# Patient Record
Sex: Female | Born: 1965 | Race: White | Hispanic: No | Smoking: Former smoker
Health system: Southern US, Community
[De-identification: ages and names within clinical notes are randomized; demographics above are authoritative.]

## PROBLEM LIST (undated history)

## (undated) DIAGNOSIS — M779 Enthesopathy, unspecified: Secondary | ICD-10-CM

## (undated) DIAGNOSIS — E781 Pure hyperglyceridemia: Secondary | ICD-10-CM

## (undated) DIAGNOSIS — K5792 Diverticulitis of intestine, part unspecified, without perforation or abscess without bleeding: Secondary | ICD-10-CM

## (undated) DIAGNOSIS — B009 Herpesviral infection, unspecified: Secondary | ICD-10-CM

## (undated) DIAGNOSIS — K08409 Partial loss of teeth, unspecified cause, unspecified class: Secondary | ICD-10-CM

## (undated) HISTORY — PX: ABDOMINAL HYSTERECTOMY: SHX81

## (undated) HISTORY — PX: TONSILLECTOMY: SUR1361

## (undated) HISTORY — PX: OVARIAN CYST REMOVAL: SHX89

## (undated) HISTORY — PX: PLACEMENT OF BREAST IMPLANTS: SHX6334

---

## 2011-10-20 ENCOUNTER — Emergency Department: Payer: Self-pay | Admitting: Emergency Medicine

## 2012-01-20 ENCOUNTER — Ambulatory Visit: Payer: Self-pay | Admitting: Gastroenterology

## 2012-01-24 LAB — PATHOLOGY REPORT

## 2012-12-28 DIAGNOSIS — E781 Pure hyperglyceridemia: Secondary | ICD-10-CM | POA: Insufficient documentation

## 2012-12-28 DIAGNOSIS — Z8249 Family history of ischemic heart disease and other diseases of the circulatory system: Secondary | ICD-10-CM | POA: Insufficient documentation

## 2013-12-26 ENCOUNTER — Ambulatory Visit: Payer: Self-pay | Admitting: Family Medicine

## 2013-12-26 HISTORY — PX: AUGMENTATION MAMMAPLASTY: SUR837

## 2013-12-31 ENCOUNTER — Ambulatory Visit: Payer: Self-pay | Admitting: Family Medicine

## 2014-12-12 ENCOUNTER — Ambulatory Visit
Admission: RE | Admit: 2014-12-12 | Discharge: 2014-12-12 | Disposition: A | Discharge status: Home / Self Care | Payer: Managed Care, Other (non HMO) | Source: Ambulatory Visit | Attending: Family Medicine | Admitting: Family Medicine

## 2014-12-12 ENCOUNTER — Other Ambulatory Visit: Payer: Self-pay | Admitting: Family Medicine

## 2014-12-12 DIAGNOSIS — R1032 Left lower quadrant pain: Secondary | ICD-10-CM | POA: Diagnosis present

## 2014-12-12 MED ORDER — IOHEXOL 300 MG/ML  SOLN
100.0000 mL | Freq: Once | INTRAMUSCULAR | Status: AC | PRN
  Administered 2014-12-12: 100 mL via INTRAVENOUS

## 2014-12-14 DIAGNOSIS — Z8719 Personal history of other diseases of the digestive system: Secondary | ICD-10-CM | POA: Insufficient documentation

## 2014-12-30 ENCOUNTER — Ambulatory Visit
Admission: RE | Admit: 2014-12-30 | Discharge: 2014-12-30 | Disposition: A | Discharge status: Home / Self Care | Payer: Managed Care, Other (non HMO) | Source: Ambulatory Visit | Attending: Gastroenterology | Admitting: Gastroenterology

## 2014-12-30 ENCOUNTER — Other Ambulatory Visit: Payer: Self-pay | Admitting: Gastroenterology

## 2014-12-30 DIAGNOSIS — R1084 Generalized abdominal pain: Secondary | ICD-10-CM | POA: Diagnosis present

## 2014-12-30 DIAGNOSIS — R1031 Right lower quadrant pain: Secondary | ICD-10-CM | POA: Diagnosis present

## 2014-12-30 DIAGNOSIS — K560 Paralytic ileus: Secondary | ICD-10-CM | POA: Insufficient documentation

## 2014-12-30 DIAGNOSIS — R1032 Left lower quadrant pain: Secondary | ICD-10-CM | POA: Diagnosis present

## 2014-12-31 ENCOUNTER — Ambulatory Visit
Admission: RE | Admit: 2014-12-31 | Discharge: 2014-12-31 | Disposition: A | Discharge status: Home / Self Care | Payer: Managed Care, Other (non HMO) | Source: Ambulatory Visit | Attending: Gastroenterology | Admitting: Gastroenterology

## 2014-12-31 ENCOUNTER — Other Ambulatory Visit: Payer: Self-pay | Admitting: Gastroenterology

## 2014-12-31 ENCOUNTER — Other Ambulatory Visit
Admission: RE | Admit: 2014-12-31 | Discharge: 2014-12-31 | Disposition: A | Discharge status: Home / Self Care | Payer: Managed Care, Other (non HMO) | Source: Ambulatory Visit | Attending: *Deleted | Admitting: *Deleted

## 2014-12-31 DIAGNOSIS — R1031 Right lower quadrant pain: Secondary | ICD-10-CM

## 2014-12-31 DIAGNOSIS — R1084 Generalized abdominal pain: Secondary | ICD-10-CM

## 2014-12-31 DIAGNOSIS — I700 Atherosclerosis of aorta: Secondary | ICD-10-CM | POA: Insufficient documentation

## 2014-12-31 DIAGNOSIS — R109 Unspecified abdominal pain: Secondary | ICD-10-CM | POA: Diagnosis not present

## 2014-12-31 DIAGNOSIS — R1032 Left lower quadrant pain: Secondary | ICD-10-CM

## 2014-12-31 LAB — C DIFFICILE QUICK SCREEN W PCR REFLEX
C DIFFICILE (CDIFF) INTERP: NEGATIVE
C DIFFICILE (CDIFF) TOXIN: NEGATIVE
C DIFFICLE (CDIFF) ANTIGEN: NEGATIVE

## 2014-12-31 MED ORDER — IOHEXOL 300 MG/ML  SOLN
100.0000 mL | Freq: Once | INTRAMUSCULAR | Status: AC | PRN
  Administered 2014-12-31: 100 mL via INTRAVENOUS

## 2015-03-05 ENCOUNTER — Encounter: Payer: Self-pay | Admitting: *Deleted

## 2015-03-06 ENCOUNTER — Ambulatory Visit
Admission: RE | Admit: 2015-03-06 | Discharge: 2015-03-06 | Disposition: A | Discharge status: Home / Self Care | Payer: Managed Care, Other (non HMO) | Source: Ambulatory Visit | Attending: Gastroenterology | Admitting: Gastroenterology

## 2015-03-06 ENCOUNTER — Encounter
Admission: RE | Disposition: A | Discharge status: Home / Self Care | Payer: Self-pay | Source: Ambulatory Visit | Attending: Gastroenterology

## 2015-03-06 ENCOUNTER — Ambulatory Visit: Payer: Managed Care, Other (non HMO) | Admitting: Anesthesiology

## 2015-03-06 DIAGNOSIS — Z1211 Encounter for screening for malignant neoplasm of colon: Secondary | ICD-10-CM | POA: Diagnosis present

## 2015-03-06 DIAGNOSIS — K621 Rectal polyp: Secondary | ICD-10-CM | POA: Diagnosis not present

## 2015-03-06 DIAGNOSIS — K5730 Diverticulosis of large intestine without perforation or abscess without bleeding: Secondary | ICD-10-CM | POA: Diagnosis not present

## 2015-03-06 DIAGNOSIS — Z86010 Personal history of colonic polyps: Secondary | ICD-10-CM | POA: Diagnosis present

## 2015-03-06 HISTORY — DX: Diverticulitis of intestine, part unspecified, without perforation or abscess without bleeding: K57.92

## 2015-03-06 HISTORY — DX: Herpesviral infection, unspecified: B00.9

## 2015-03-06 HISTORY — DX: Partial loss of teeth, unspecified cause, unspecified class: K08.409

## 2015-03-06 HISTORY — PX: COLONOSCOPY WITH PROPOFOL: SHX5780

## 2015-03-06 HISTORY — DX: Enthesopathy, unspecified: M77.9

## 2015-03-06 HISTORY — DX: Pure hyperglyceridemia: E78.1

## 2015-03-06 SURGERY — COLONOSCOPY WITH PROPOFOL
Anesthesia: General

## 2015-03-06 MED ORDER — MIDAZOLAM HCL 2 MG/2ML IJ SOLN
INTRAMUSCULAR | Status: DC | PRN
  Administered 2015-03-06: 2 mg via INTRAVENOUS

## 2015-03-06 MED ORDER — SODIUM CHLORIDE 0.9 % IV SOLN: INTRAVENOUS | Status: DC

## 2015-03-06 MED ORDER — SODIUM CHLORIDE 0.9 % IV SOLN
INTRAVENOUS | Status: DC
  Administered 2015-03-06: 1000 mL via INTRAVENOUS

## 2015-03-06 MED ORDER — PHENYLEPHRINE HCL 10 MG/ML IJ SOLN
INTRAMUSCULAR | Status: DC | PRN
  Administered 2015-03-06 (×2): 100 ug via INTRAVENOUS

## 2015-03-06 MED ORDER — PROPOFOL 500 MG/50ML IV EMUL
INTRAVENOUS | Status: DC | PRN
  Administered 2015-03-06: 100 ug/kg/min via INTRAVENOUS

## 2015-03-06 NOTE — Op Note (Signed)
[facility] Gastroenterology Patient Name: Vanessa Saunders Procedure Date: 03/06/2015 10:39 AM MRN: [id number] Account #: [id number] Date of Birth: 1965-08-25 Admit Type: Outpatient Age: 49 Room: [facility] ENDO ROOM 2 Gender: Female Note Status: Finalized Procedure:         Colonoscopy Indications:       Personal history of colonic polyps, Follow-up of                     diverticulitis Providers:         Christena Deem, MD Referring MD:      Leim Fabry, MD (Referring MD) Medicines:         Monitored Anesthesia Care Complications:     No immediate complications. Procedure:         Pre-Anesthesia Assessment:                    - ASA Grade Assessment: II - A patient with mild systemic                     disease.                    After obtaining informed consent, the colonoscope was                     passed under direct vision. Throughout the procedure, the                     patient's blood pressure, pulse, and oxygen saturations                     were monitored continuously. The Colonoscope was                     introduced through the anus and advanced to the the cecum,                     identified by appendiceal orifice and ileocecal valve. The                     colonoscopy was performed without difficulty. The patient                     tolerated the procedure well. The quality of the bowel                     preparation was good. Findings:      Many small to medium diverticula were found in the sigmoid colon and in       the descending colon.      A 1 mm polyp was found in the rectum. The polyp was sessile. These       polyps were removed with a cold biopsy forceps. Resection and retrieval       were complete.      A 3 mm polyp was found in the rectum. The polyp was sessile. The polyp       was removed with a cold snare. Resection and retrieval were complete.      The digital rectal exam was normal.      The exam was otherwise without  abnormality. Impression:        - Diverticulosis in the sigmoid colon and in the  descending colon.                    - One 1 mm polyp in the rectum. Resected and retrieved.                    - One 3 mm polyp in the rectum. Resected and retrieved.                    - The examination was otherwise normal. Recommendation:    - Discharge patient to home. Procedure Code(s): --- Professional ---                    [id number], Colonoscopy, flexible; with removal of tumor(s),                     polyp(s), or other lesion(s) by snare technique                    45380, 59, Colonoscopy, flexible; with biopsy, single or                     multiple Diagnosis Code(s): --- Professional ---                    K62.1, Rectal polyp                    Z86.010, Personal history of colonic polyps                    K57.32, Diverticulitis of large intestine without                     perforation or abscess without bleeding                    K57.30, Diverticulosis of large intestine without                     perforation or abscess without bleeding CPT copyright 2014 American Medical Association. All rights reserved. The codes documented in this report are preliminary and upon coder review may  be revised to meet current compliance requirements. Christena DeemMartin U Jamerius Boeckman, MD 03/06/2015 11:16:12 AM This report has been signed electronically. Number of Addenda: 0 Note Initiated On: 03/06/2015 10:39 AM Scope Withdrawal Time: 0 hours 12 minutes 50 seconds  Total Procedure Duration: 0 hours 22 minutes 34 seconds       [facility]

## 2015-03-06 NOTE — Anesthesia Postprocedure Evaluation (Signed)
Anesthesia Post Note  Patient: Vanessa Saunders  Procedure(s) Performed: Procedure(s) (LRB): COLONOSCOPY WITH PROPOFOL (N/A)  Patient location during evaluation: PACU Anesthesia Type: General Level of consciousness: awake and alert Pain management: pain level controlled Vital Signs Assessment: post-procedure vital signs reviewed and stable Respiratory status: spontaneous breathing and respiratory function stable Cardiovascular status: blood pressure returned to baseline and stable Anesthetic complications: no    Last Vitals:  Filed Vitals:   03/06/15 1140 03/06/15 1150  BP: 98/50 108/83  Pulse:    Temp:    Resp: 15 14    Last Pain: There were no vitals filed for this visit.               KEPHART,WILLIAM K

## 2015-03-06 NOTE — Anesthesia Preprocedure Evaluation (Signed)
Anesthesia Evaluation  Patient identified by MRN, date of birth, ID band Patient awake    Reviewed: Allergy & Precautions, NPO status , Patient's Chart, lab work & pertinent test results  History of Anesthesia Complications (+) PROLONGED EMERGENCE  Airway Mallampati: III       Dental  (+) Teeth Intact   Pulmonary neg pulmonary ROS, former smoker,           Cardiovascular negative cardio ROS       Neuro/Psych negative neurological ROS     GI/Hepatic negative GI ROS, Neg liver ROS,   Endo/Other  negative endocrine ROS  Renal/GU negative Renal ROS     Musculoskeletal   Abdominal   Peds  Hematology negative hematology ROS (+)   Anesthesia Other Findings   Reproductive/Obstetrics                             Anesthesia Physical Anesthesia Plan  ASA: II  Anesthesia Plan: General   Post-op Pain Management:    Induction: Intravenous  Airway Management Planned: Nasal Cannula  Additional Equipment:   Intra-op Plan:   Post-operative Plan:   Informed Consent: I have reviewed the patients History and Physical, chart, labs and discussed the procedure including the risks, benefits and alternatives for the proposed anesthesia with the patient or authorized representative who has indicated his/her understanding and acceptance.     Plan Discussed with:   Anesthesia Plan Comments:         Anesthesia Quick Evaluation

## 2015-03-06 NOTE — H&P (Signed)
Outpatient short stay form Pre-procedure 03/06/2015 10:36 AM Christena DeemMartin U Skulskie MD  Primary Physician: Dr. Leim FabryBarbara Aldridge  Reason for visit:  Colonoscopy  History of present illness:  Patient is a 49 year old female presenting today for a follow-up on an episode of diverticulitis. She also has a personal history of colon polyps. She last took medication about 6 weeks ago. He has not had any pain for about 2-3 weeks. He had a CT scan that showed diverticulitis had resolved. No other apparent abnormalities. There is no family history of colon cancer.    Current facility-administered medications:  .  0.9 %  sodium chloride infusion, , Intravenous, Continuous, Christena DeemMartin U Skulskie, MD, Last Rate: 20 mL/hr at 03/06/15 0925, 1,000 mL at 03/06/15 0925 .  0.9 %  sodium chloride infusion, , Intravenous, Continuous, Christena DeemMartin U Skulskie, MD  Prescriptions prior to admission  Medication Sig Dispense Refill Last Dose  . acyclovir (ZOVIRAX) 400 MG tablet Take 400 mg by mouth 5 (five) times daily.   03/05/2015 at Unknown time  . metroNIDAZOLE (FLAGYL) 500 MG tablet Take 500 mg by mouth 3 (three) times daily.   03/05/2015 at Unknown time  . nystatin (MYCOSTATIN) 100000 UNIT/ML suspension Take 5 mLs by mouth 4 (four) times daily.   03/05/2015 at Unknown time  . sulfamethoxazole-trimethoprim (BACTRIM DS,SEPTRA DS) 800-160 MG tablet Take 1 tablet by mouth 2 (two) times daily.        Allergies  Allergen Reactions  . Prednisone Palpitations     Past Medical History  Diagnosis Date  . HSV-2 (herpes simplex virus 2) infection   . High triglycerides   . Diverticulitis   . H/O wisdom tooth extraction   . Bone spur RT HAND    Review of systems:      Physical Exam    Heart and lungs: Regular rate and rhythm without rub or gallop, lungs are bilaterally clear.    HEENT: Normocephalic atraumatic eyes are anicteric    Other:     Pertinant exam for procedure: Soft nontender nondistended bowel sounds  positive normoactive    Planned proceedures: Colonoscopy and indicated procedures. I have discussed the risks benefits and complications of procedures to include not limited to bleeding, infection, perforation and the risk of sedation and the patient wishes to proceed.    Christena DeemMartin U Skulskie, MD Gastroenterology 03/06/2015  10:36 AM

## 2015-03-06 NOTE — Transfer of Care (Signed)
Immediate Anesthesia Transfer of Care Note  Patient: Vanessa Saunders  Procedure(s) Performed: Procedure(s): COLONOSCOPY WITH PROPOFOL (N/A)  Patient Location: PACU  Anesthesia Type:General  Level of Consciousness: awake, alert  and oriented  Airway & Oxygen Therapy: Patient Spontanous Breathing and Patient connected to nasal cannula oxygen  Post-op Assessment: Report given to RN  Post vital signs: Reviewed and stable  Last Vitals:  Filed Vitals:   03/06/15 0911  BP: 105/59  Pulse: 73  Temp: 36 C  Resp: 16    Complications: No apparent anesthesia complications

## 2015-03-07 ENCOUNTER — Encounter: Payer: Self-pay | Admitting: Gastroenterology

## 2015-03-10 LAB — SURGICAL PATHOLOGY

## 2015-09-20 DIAGNOSIS — E039 Hypothyroidism, unspecified: Secondary | ICD-10-CM | POA: Insufficient documentation

## 2015-10-13 ENCOUNTER — Ambulatory Visit
Admission: RE | Admit: 2015-10-13 | Discharge: 2015-10-13 | Disposition: A | Discharge status: Home / Self Care | Payer: Managed Care, Other (non HMO) | Source: Ambulatory Visit | Attending: Family Medicine | Admitting: Family Medicine

## 2015-10-13 ENCOUNTER — Other Ambulatory Visit: Payer: Self-pay | Admitting: Family Medicine

## 2015-10-13 DIAGNOSIS — Z1231 Encounter for screening mammogram for malignant neoplasm of breast: Secondary | ICD-10-CM

## 2016-01-25 ENCOUNTER — Encounter: Payer: Self-pay | Admitting: *Deleted

## 2016-01-25 DIAGNOSIS — R768 Other specified abnormal immunological findings in serum: Secondary | ICD-10-CM | POA: Insufficient documentation

## 2016-02-03 ENCOUNTER — Ambulatory Visit (INDEPENDENT_AMBULATORY_CARE_PROVIDER_SITE_OTHER): Payer: Managed Care, Other (non HMO) | Admitting: General Surgery

## 2016-02-03 ENCOUNTER — Encounter: Payer: Self-pay | Admitting: General Surgery

## 2016-02-03 VITALS — BP 124/76 | HR 80 | Ht 62.0 in | Wt 155.0 lb

## 2016-02-03 DIAGNOSIS — K5732 Diverticulitis of large intestine without perforation or abscess without bleeding: Secondary | ICD-10-CM

## 2016-02-03 NOTE — Progress Notes (Signed)
Patient ID: Vanessa Saunders, female   DOB: 11/11/1965, 50 y.o.   MRN: [id number]  Chief Complaint  Patient presents with  . Abdominal Pain    HPI Vanessa Saunders is a 50 y.o. female here today for a evaluation of abdominal pain in her left lower quadrant.. Patient states this has been going on for 13 months. In September of 2016, she developed significant LLQ abdominal pain and confirmed diverticulitis with CT.  Was treated with antibiotics. Follow-up CT a month later showed resolution of diverticulitis.  Since that time, she has had at least 2 episodes of significant LLQ abdominal pain treated each time with antibiotics.  In between bouts, she has persistent milder pain in the same location, worse with constipation. She also has required larger doses of Miralax to correct her constipation. Last colonoscopy was done in 03/06/2015. I have reviewed the history of present illness with the patient.  HPI  Past Medical History:  Diagnosis Date  . Bone spur RT HAND  . Diverticulitis   . H/O wisdom tooth extraction   . High triglycerides   . HSV-2 (herpes simplex virus 2) infection     Past Surgical History:  Procedure Laterality Date  . ABDOMINAL HYSTERECTOMY    . AUGMENTATION MAMMAPLASTY Bilateral 12/2013   after mammo  . COLONOSCOPY WITH PROPOFOL N/A 03/06/2015   Procedure: COLONOSCOPY WITH PROPOFOL;  Surgeon: Lollie Sails, MD;  Location[facility] ENDOSCOPY;  Service: Endoscopy;  Laterality: N/A;  . OVARIAN CYST REMOVAL    . PLACEMENT OF BREAST IMPLANTS    . TONSILLECTOMY      Family History  Problem Relation Age of Onset  . Breast cancer Neg Hx     Social History Social History  Substance Use Topics  . Smoking status: Former Research scientist (life sciences)  . Smokeless tobacco: Never Used  . Alcohol use Yes    Allergies  Allergen Reactions  . Prednisone Palpitations    Current Outpatient Prescriptions  Medication Sig Dispense Refill  . acyclovir (ZOVIRAX) 400 MG tablet Take 400 mg by mouth 5  (five) times daily.    Marland Kitchen dicyclomine (BENTYL) 10 MG capsule Take 10 mg by mouth 4 (four) times daily -  before meals and at bedtime.    Marland Kitchen levothyroxine (SYNTHROID, LEVOTHROID) 75 MCG tablet Take 75 mcg by mouth daily before breakfast.    . ondansetron (ZOFRAN) 4 MG tablet Take 4 mg by mouth every 8 (eight) hours as needed for nausea or vomiting.    . meloxicam (MOBIC) 7.5 MG tablet Take 7.5 mg by mouth daily.     No current facility-administered medications for this visit.     Review of Systems Review of Systems  Constitutional: Negative.   Respiratory: Negative.   Cardiovascular: Negative.   Gastrointestinal: Positive for abdominal pain and nausea.    Blood pressure 124/76, pulse 80, height _0  (1.575 m), weight 155 lb (70.3 kg).  Physical Exam Physical Exam  Constitutional: She is oriented to person, place, and time. She appears well-developed and well-nourished.  HENT:  Mouth/Throat: Oropharynx is clear and moist.  Eyes: Conjunctivae are normal. No scleral icterus.  Neck: Neck supple.  Cardiovascular: Normal rate, regular rhythm and normal heart sounds.   Pulmonary/Chest: Effort normal and breath sounds normal.  Abdominal: Soft. Normal appearance and bowel sounds are normal. There is tenderness ( LLQ focally tender) in the left lower quadrant.  Lymphadenopathy:    She has no cervical adenopathy.  Neurological: She is alert and oriented to person, place,  and time.  Skin: Skin is warm and dry.  Psychiatric: Her behavior is normal.    Data Reviewed Notes and CT scan reviewed Colonoscopy showing diverticulosis. Few benign polyps removed.  Assessment    Diverticulitis of sigmoid colon    Plan    Barium enema to examine extent of diverticulosis  Patient has been scheduled for a barium enema for 02-10-16 at [facility] at 10 am (arrive 9:30 am). Prep: NPO after midnight and pick up prep kit.   Discussed laparoscopic partial colectomy given the history of chronic pain and  several episodes of acute diverticulitis within the last year.  Risks and benefits were discussed. This is a relative indication and patient is agreeable to this. Requested barium enema to assess the extent of diverticulosis. She is to return after the barium enema for further discussion and scheduling.     This information has been scribed by Gaspar Cola CMA.    Domenik Trice G 02/03/2016, 3:31 PM

## 2016-02-03 NOTE — Patient Instructions (Addendum)
Barium enema The patient is aware to call back for any questions or concerns. Laparoscopic Colectomy Laparoscopic colectomy is surgery to remove part or all of the large intestine (colon). This procedure is used to treat several conditions, including:  Inflammation and infection of the colon (diverticulitis).  Tumors or masses in the colon.  Inflammatory bowel disease, such as Crohn disease or ulcerative colitis. Colectomy is an option when symptoms cannot be controlled with medicines.  Bleeding from the colon that cannot be controlled by another method.  Blockage or obstruction of the colon. LET [facility] KNOW ABOUT:  Any allergies you have.  All medicines you are taking, including vitamins, herbs, eye drops, creams, and over-the-counter medicines.  Previous problems you or members of your family have had with the use of anesthetics.  Any blood disorders you have.  Previous surgeries you have had.  Medical conditions you have. RISKS AND COMPLICATIONS Generally, this is a safe procedure. However, as with any procedure, complications can occur. Possible complications include:  Infection.  Bleeding.  Damage to other organs.  Leaking from where the colon was sewn together.  Future blockage of the small intestines from scar tissue. Another surgery may be needed to repair this. In some cases, complications such as damage to other organs or excessive bleeding may require the surgeon to convert from a laparoscopic procedure to an open procedure. This involves making a larger incision in the abdomen to perform the procedure. BEFORE THE PROCEDURE  Ask your health care provider about changing or stopping any regular medicines.  You may be prescribed an oral bowel prep. This involves drinking a large amount of medicated liquid, starting the day before your surgery. The liquid will cause you to have multiple loose stools until your stool is almost clear or light green.  This cleans out your colon in preparation for the surgery.  Do not eat or drink anything else once you have started the bowel prep, unless your health care provider tells you it is safe to do so.  You may also be given antibiotic pills to clean out your colon of bacteria. Be sure to follow the directions carefully and take the medicine at the correct time. PROCEDURE   Small monitors will be put on your body. They are used to check your heart, blood pressure, and oxygen level.  An IV access tube will be put into one of your veins. Medicine will be able to flow directly into your body through this IV tube.  You might be given a medicine to help you relax (sedative).  You will be given a medicine to make you sleep through the procedure (general anesthetic). A breathing tube may be placed into your lungs during the procedure.  A thin, flexible tube (catheter) will be placed into your bladder to collect urine.  A tube may be put in through your nose. It is called a nasogastric tube. It is used to remove stomach fluids after surgery until the intestines start working again.  Your abdomen will be filled with air so that it expands. This gives the surgeon more room to operate and makes your organs easier to see.  Several small cuts (incisions) are made in your abdomen.  A thin, lighted tube with a tiny camera on the end (laparoscope) is put through one of the small incisions. The camera on the laparoscope sends a picture to a TV screen in the operating room. This gives the surgeon a good view inside your abdomen.  Hollow tubes are put through the other small incisions in your abdomen. The tools needed for the procedure are put through these tubes.  Clamps or staples are put on both ends of the diseased part of the colon.  The part of the intestine between the clamps or staples is removed.  If possible, the ends of the healthy colon that remain will be stitched or stapled together to allow your  body to expel waste (stool).  Sometimes, the remaining colon cannot be stitched back together. If this is the case, a colostomy is needed. For a colostomy:  An opening (stoma) to the outside of your body is made through the abdomen.  The end of the colon is brought to the opening. It is stitched to the skin.  A bag is attached to the opening. Stool will drain into this bag. The bag is removable.  The colostomy can be temporary or permanent.  The incisions from the colectomy are closed with stitches or staples. AFTER THE PROCEDURE  You will be monitored closely in a recovery area until you are stable and doing well. You will then be moved to a regular hospital room.  You will need to receive fluids through an IV tube until your bowel function has returned. This may take 1-3 days. Once your bowels are working again, you will be started on clear liquids and then advanced to solid food as tolerated.  You will be given pain medicines to control your pain.   This information is not intended to replace advice given to you by your health care provider. Make sure you discuss any questions you have with your health care provider.   Document Released: 06/04/2002 Document Revised: 01/02/2013 Document Reviewed: 10/24/2012 Elsevier Interactive Patient Education [employer]2016 [employer] Inc.

## 2016-02-10 ENCOUNTER — Other Ambulatory Visit: Payer: Self-pay | Admitting: General Surgery

## 2016-02-10 ENCOUNTER — Ambulatory Visit
Admission: RE | Admit: 2016-02-10 | Discharge: 2016-02-10 | Disposition: A | Discharge status: Home / Self Care | Payer: Managed Care, Other (non HMO) | Source: Ambulatory Visit | Attending: General Surgery | Admitting: General Surgery

## 2016-02-10 DIAGNOSIS — K5732 Diverticulitis of large intestine without perforation or abscess without bleeding: Secondary | ICD-10-CM

## 2016-02-10 DIAGNOSIS — K5730 Diverticulosis of large intestine without perforation or abscess without bleeding: Secondary | ICD-10-CM | POA: Diagnosis not present

## 2016-02-11 ENCOUNTER — Telehealth: Payer: Self-pay | Admitting: *Deleted

## 2016-02-11 ENCOUNTER — Ambulatory Visit: Payer: Managed Care, Other (non HMO) | Admitting: General Surgery

## 2016-02-11 DIAGNOSIS — K5732 Diverticulitis of large intestine without perforation or abscess without bleeding: Secondary | ICD-10-CM | POA: Insufficient documentation

## 2016-02-11 NOTE — Telephone Encounter (Signed)
Patient cancelled follow up appointment to with Dr. Evette CristalSankar on 02/11/16. Patient [place] and will follow her care,per patient.

## 2016-03-15 DIAGNOSIS — Z9049 Acquired absence of other specified parts of digestive tract: Secondary | ICD-10-CM | POA: Insufficient documentation

## 2016-05-05 ENCOUNTER — Ambulatory Visit
Admission: EM | Admit: 2016-05-05 | Discharge: 2016-05-05 | Disposition: A | Discharge status: Home / Self Care | Payer: Commercial Managed Care - PPO | Attending: Family Medicine | Admitting: Family Medicine

## 2016-05-05 DIAGNOSIS — J111 Influenza due to unidentified influenza virus with other respiratory manifestations: Secondary | ICD-10-CM

## 2016-05-05 DIAGNOSIS — R509 Fever, unspecified: Secondary | ICD-10-CM

## 2016-05-05 DIAGNOSIS — R69 Illness, unspecified: Secondary | ICD-10-CM

## 2016-05-05 DIAGNOSIS — R5383 Other fatigue: Secondary | ICD-10-CM

## 2016-05-05 DIAGNOSIS — R6889 Other general symptoms and signs: Secondary | ICD-10-CM

## 2016-05-05 DIAGNOSIS — J3489 Other specified disorders of nose and nasal sinuses: Secondary | ICD-10-CM | POA: Diagnosis not present

## 2016-05-05 DIAGNOSIS — M791 Myalgia: Secondary | ICD-10-CM

## 2016-05-05 MED ORDER — HYDROCOD POLST-CPM POLST ER 10-8 MG/5ML PO SUER: 5.0000 mL | Freq: Two times a day (BID) | ORAL | 0 refills | Status: DC | PRN

## 2016-05-05 MED ORDER — FEXOFENADINE-PSEUDOEPHED ER 180-240 MG PO TB24: 1.0000 | ORAL_TABLET | Freq: Every day | ORAL | 0 refills | Status: DC

## 2016-05-05 MED ORDER — MELOXICAM 15 MG PO TABS: 15.0000 mg | ORAL_TABLET | Freq: Every day | ORAL | 1 refills | Status: DC

## 2016-05-05 MED ORDER — OSELTAMIVIR PHOSPHATE 75 MG PO CAPS: 75.0000 mg | ORAL_CAPSULE | Freq: Two times a day (BID) | ORAL | 0 refills | Status: DC

## 2016-05-05 NOTE — ED Provider Notes (Signed)
[facility]    CSN: [id number] Arrival date & time: 05/05/16  1159     History   Chief Complaint Chief Complaint  Patient presents with  . Cough    HPI Vanessa Saunders is a 51 y.o. female.   Patient is a 51 year old white female who's had a history of congestion cough body aches sore throat but no fever. He she was at work today Mr. out because of her general malaise and feeling so bad. She just returned back to work on Monday when she underwent a bowel resection because of diverticulitis and she had about 12 inches of her lower colon removed. She states Monday she started not feeling well Tuesday she was coughing congested and problem when she coughs it makes the surgical site her. She wasn't feeling well yesterday either and today the findings are out because she looks so bad. She states she's coughing aching all over nasal congestion and sore throat. She does not like this is strep she does not have her tonsils and feels is more irritation from postnasal drainage. She has not had a fever. But she does report working around people who had or been undergoing flu contact.. Long bowel resection she's had a hysterectomy tubal ligation versus removal placement of breast implants. Family medical history essentially noncontributory Smoking about 10 years ago and she is allergic to prednisone.   The history is provided by the patient. No language interpreter was used.  Influenza  Presenting symptoms: cough, fatigue, fever, headache, myalgias, rhinorrhea, shortness of breath and sore throat   Severity:  Moderate Onset quality:  Sudden Duration:  3 days Progression:  Worsening Chronicity:  New Relieved by:  Nothing Worsened by:  Nothing Ineffective treatments:  None tried Associated symptoms: nasal congestion   Associated symptoms: no chills and no decreased appetite     Past Medical History:  Diagnosis Date  . Bone spur RT HAND  . Diverticulitis   . H/O wisdom tooth  extraction   . High triglycerides   . HSV-2 (herpes simplex virus 2) infection     There are no active problems to display for this patient.   Past Surgical History:  Procedure Laterality Date  . ABDOMINAL HYSTERECTOMY    . AUGMENTATION MAMMAPLASTY Bilateral 12/2013   after mammo  . COLONOSCOPY WITH PROPOFOL N/A 03/06/2015   Procedure: COLONOSCOPY WITH PROPOFOL;  Surgeon: Christena Deem, MD;  Location: [facility] ENDOSCOPY;  Service: Endoscopy;  Laterality: N/A;  . OVARIAN CYST REMOVAL    . PLACEMENT OF BREAST IMPLANTS    . TONSILLECTOMY      OB History    Obstetric Comments   1st Menstrual Cycle:  10 1st Pregnancy:  16        Home Medications    Prior to Admission medications   Medication Sig Start Date End Date Taking? Authorizing Provider  acyclovir (ZOVIRAX) 400 MG tablet Take 400 mg by mouth 5 (five) times daily.   Yes Historical Provider, MD  levothyroxine (SYNTHROID, LEVOTHROID) 75 MCG tablet Take 50 mcg by mouth daily before breakfast.    Yes Historical Provider, MD  linaclotide (LINZESS) 145 MCG CAPS capsule Take 145 mcg by mouth daily before breakfast.   Yes Historical Provider, MD  chlorpheniramine-HYDROcodone (TUSSIONEX PENNKINETIC ER) 10-8 MG/5ML SUER Take 5 mLs by mouth every 12 (twelve) hours as needed for cough. 05/05/16   Hassan Rowan, MD  dicyclomine (BENTYL) 10 MG capsule Take 10 mg by mouth 4 (four) times daily -  before  meals and at bedtime.    Historical Provider, MD  fexofenadine-pseudoephedrine (ALLEGRA-D ALLERGY & CONGESTION) 180-240 MG 24 hr tablet Take 1 tablet by mouth daily. 05/05/16   Hassan Rowan, MD  meloxicam (MOBIC) 15 MG tablet Take 1 tablet (15 mg total) by mouth daily. 05/05/16   Hassan Rowan, MD  meloxicam (MOBIC) 7.5 MG tablet Take 7.5 mg by mouth daily.    Historical Provider, MD  ondansetron (ZOFRAN) 4 MG tablet Take 4 mg by mouth every 8 (eight) hours as needed for nausea or vomiting.    Historical Provider, MD  oseltamivir (TAMIFLU) 75 MG  capsule Take 1 capsule (75 mg total) by mouth 2 (two) times daily. 05/05/16   Hassan Rowan, MD    Family History Family History  Problem Relation Age of Onset  . Breast cancer Neg Hx     Social History Social History  Substance Use Topics  . Smoking status: Former Games developer  . Smokeless tobacco: Never Used  . Alcohol use Yes     Allergies   Prednisone   Review of Systems Review of Systems  Constitutional: Positive for fatigue and fever. Negative for chills and decreased appetite.  HENT: Positive for congestion, rhinorrhea and sore throat.   Respiratory: Positive for cough and shortness of breath.   Musculoskeletal: Positive for myalgias.  Neurological: Positive for headaches.  All other systems reviewed and are negative.    Physical Exam Triage Vital Signs ED Triage Vitals  Enc Vitals Group     BP 05/05/16 1212 103/66     Pulse Rate 05/05/16 1212 93     Resp 05/05/16 1212 18     Temp 05/05/16 1212 98.8 F (37.1 C)     Temp Source 05/05/16 1212 Oral     SpO2 05/05/16 1212 96 %     Weight 05/05/16 1210 158 lb (71.7 kg)     Height 05/05/16 1210 5\' 2"  (1.575 m)     Head Circumference --      Peak Flow --      Pain Score 05/05/16 1212 5     Pain Loc --      Pain Edu? --      Excl. in GC? --    No data found.   Updated Vital Signs BP 103/66 (BP Location: Left Arm)   Pulse 93   Temp 98.8 F (37.1 C) (Oral)   Resp 18   Ht 5\' 2"  (1.575 m)   Wt 158 lb (71.7 kg)   LMP  (LMP Unknown)   SpO2 96%   BMI 28.90 kg/m   Visual Acuity Right Eye Distance:   Left Eye Distance:   Bilateral Distance:    Right Eye Near:   Left Eye Near:    Bilateral Near:     Physical Exam  Constitutional: She is oriented to person, place, and time. She appears well-developed and well-nourished.  HENT:  Head: Normocephalic and atraumatic.  Right Ear: Hearing, tympanic membrane, external ear and ear canal normal.  Left Ear: Hearing, tympanic membrane, external ear and ear canal  normal.  Nose: Mucosal edema present. Right sinus exhibits no maxillary sinus tenderness and no frontal sinus tenderness. Left sinus exhibits no maxillary sinus tenderness and no frontal sinus tenderness.  Mouth/Throat: Uvula is midline, oropharynx is clear and moist and mucous membranes are normal. No uvula swelling. No posterior oropharyngeal erythema.  Eyes: Pupils are equal, round, and reactive to light.  Neck: Normal range of motion. Neck supple.  Cardiovascular: Normal rate and  regular rhythm.   Pulmonary/Chest: Effort normal and breath sounds normal.  Musculoskeletal: Normal range of motion.  Neurological: She is alert and oriented to person, place, and time.  Skin: Skin is warm.  Psychiatric: She has a normal mood and affect.  Vitals reviewed.    UC Treatments / Results  Labs (all labs ordered are listed, but only abnormal results are displayed) Labs Reviewed - No data to display  EKG  EKG Interpretation None       Radiology No results found.  Procedures Procedures (including critical care time)  Medications Ordered in UC Medications - No data to display   Initial Impression / Assessment and Plan / UC Course  I have reviewed the triage vital signs and the nursing notes.  Pertinent labs & imaging results that were available during my care of the patient were reviewed by me and considered in my medical decision making (see chart for details).     Explained patient nothing she has the flu. While she comes in because the cough and nasal congestion everything hit her postnasal congestion on Tuesday with cough on Wednesday. She is not she is not running a fever with myalgia aching approaching her for the flu. Tamiflu 75 mg twice a day test The cough Allegra-D for the congestion. Meloxicam for aches and pain and follow-up PCP as needed. Positive testing for the cough and gave her work note until Sunday so she was sent out of work today but she was covered now Friday and  Saturday and go back to work on Sunday.  Final Clinical Impressions(s) / UC Diagnoses   Final diagnoses:  Flu-like symptoms  Influenza-like illness    New Prescriptions Discharge Medication List as of 05/05/2016  1:24 PM    START taking these medications   Details  chlorpheniramine-HYDROcodone (TUSSIONEX PENNKINETIC ER) 10-8 MG/5ML SUER Take 5 mLs by mouth every 12 (twelve) hours as needed for cough., Starting Thu 05/05/2016, Normal    fexofenadine-pseudoephedrine (ALLEGRA-D ALLERGY & CONGESTION) 180-240 MG 24 hr tablet Take 1 tablet by mouth daily., Starting Thu 05/05/2016, Normal    !! meloxicam (MOBIC) 15 MG tablet Take 1 tablet (15 mg total) by mouth daily., Starting Thu 05/05/2016, Normal    oseltamivir (TAMIFLU) 75 MG capsule Take 1 capsule (75 mg total) by mouth 2 (two) times daily., Starting Thu 05/05/2016, Normal     !! - Potential duplicate medications found. Please discuss with provider.      Note: This dictation was prepared with Dragon dictation along with smaller phrase technology. Any transcriptional errors that result from this process are unintentional.   Hassan RowanEugene Lochlan Grygiel, MD 05/05/16 1404

## 2016-05-05 NOTE — ED Triage Notes (Signed)
Patient complains of cough,sinus pain and pressure. Patient reports that symptoms started yesterday. Patient reports that her doctor told her to come since she had a colectomy on 12/19.

## 2016-05-17 DIAGNOSIS — I7300 Raynaud's syndrome without gangrene: Secondary | ICD-10-CM | POA: Insufficient documentation

## 2017-04-27 ENCOUNTER — Other Ambulatory Visit: Payer: Self-pay | Admitting: Family Medicine

## 2017-04-27 DIAGNOSIS — Z1231 Encounter for screening mammogram for malignant neoplasm of breast: Secondary | ICD-10-CM

## 2017-05-02 ENCOUNTER — Ambulatory Visit
Admission: RE | Admit: 2017-05-02 | Discharge: 2017-05-02 | Disposition: A | Discharge status: Home / Self Care | Payer: Commercial Managed Care - PPO | Source: Ambulatory Visit | Attending: Family Medicine | Admitting: Family Medicine

## 2017-05-02 DIAGNOSIS — Z1231 Encounter for screening mammogram for malignant neoplasm of breast: Secondary | ICD-10-CM | POA: Diagnosis present

## 2017-11-13 DIAGNOSIS — K769 Liver disease, unspecified: Secondary | ICD-10-CM | POA: Insufficient documentation

## 2017-12-19 ENCOUNTER — Encounter: Payer: Self-pay | Admitting: Internal Medicine

## 2017-12-19 ENCOUNTER — Ambulatory Visit: Payer: Commercial Managed Care - PPO | Admitting: Internal Medicine

## 2017-12-19 VITALS — BP 126/80 | HR 73 | Ht 62.0 in | Wt 151.0 lb

## 2017-12-19 DIAGNOSIS — E039 Hypothyroidism, unspecified: Secondary | ICD-10-CM

## 2017-12-19 DIAGNOSIS — R5383 Other fatigue: Secondary | ICD-10-CM

## 2017-12-19 LAB — T3, FREE: T3 FREE: 3.6 pg/mL (ref 2.3–4.2)

## 2017-12-19 LAB — TSH: TSH: 0.73 u[IU]/mL (ref 0.35–4.50)

## 2017-12-19 LAB — VITAMIN D 25 HYDROXY (VIT D DEFICIENCY, FRACTURES): VITD: 22.71 ng/mL — AB (ref 30.00–100.00)

## 2017-12-19 LAB — VITAMIN B12: Vitamin B-12: 380 pg/mL (ref 211–911)

## 2017-12-19 LAB — T4, FREE: Free T4: 1.01 ng/dL (ref 0.60–1.60)

## 2017-12-19 NOTE — Progress Notes (Signed)
Patient ID: Vanessa Saunders, female   DOB: 02/21/1966, 52 y.o.   MRN: [id number]    HPI  Vanessa Saunders is a 52 y.o.-year-old female, referred by her PCP, Dr. Leim FabryBarbara Aldridge, for management of hypothyroidism.  Pt. has been dx with hypothyroidism in 2017 during w/u for fatigue, joint pain, exhaustion, weight gain >> on Levothyroxine 88 mcg (for the last year) (increased from 75 mcg).  She takes the thyroid hormone: - at night, 11 pm (6 pm dinner) - with water - no calcium, iron,  multivitamins  - Omeprazole in am  - last month, around the time she got married - has to take Linzess in am and this is the reason why she is taking levothyroxine at night  I reviewed pt's thyroid tests: 05/02/2017: TSH 0.44 (0.34-5.66) 12/05/2016: TSH 0.706 ... 04/28/2015: TSH 13.87 No results found for: TSH, FREET4, T3FREE  Antithyroid antibodies: No results found for: THGAB No components found for: TPOAB  Pt describes: -+ Weight gain -+ Fatigue -+ Both heat and cold intolerance - no depression - + constipation (she has IBS) - No hair loss  Pt denies feeling nodules in neck, hoarseness, dysphagia/odynophagia, SOB with lying down.  She has + FH of thyroid disorders in: Brother and mother. No FH of thyroid cancer.  No h/o radiation tx to head or neck. No recent use of iodine supplements.  Pt. also has a history of Raynaud's phenomenon. FH of RA in M, B, celiac disease.  She is walking at the brisk pace/jogging on her treadmill at home several times a week, in the morning.  She leaves home to get to work at 5 AM.  ROS: Constitutional: + See HPI, + nocturia Eyes: no blurry vision, no xerophthalmia ENT: no sore throat, no nodules palpated in throat, no dysphagia/odynophagia, no hoarseness Cardiovascular: no CP/SOB/palpitations/leg swelling Respiratory: no cough/SOB Gastrointestinal: no N/V/D/ + C/+ acid reflux Musculoskeletal: + Both muscle/joint aches Skin: no rashes Neurological: no  tremors/numbness/tingling/dizziness Psychiatric: no depression/anxiety + Low libido  Past Medical History:  Diagnosis Date  . Bone spur RT HAND  . Diverticulitis   . H/O wisdom tooth extraction   . High triglycerides   . HSV-2 (herpes simplex virus 2) infection    Past Surgical History:  Procedure Laterality Date  . ABDOMINAL HYSTERECTOMY    . AUGMENTATION MAMMAPLASTY Bilateral 12/2013   after mammo  . COLONOSCOPY WITH PROPOFOL N/A 03/06/2015   Procedure: COLONOSCOPY WITH PROPOFOL;  Surgeon: Christena DeemMartin U Skulskie, MD;  [facility];  Service: Endoscopy;  Laterality: N/A;  . OVARIAN CYST REMOVAL    . PLACEMENT OF BREAST IMPLANTS    . TONSILLECTOMY     Social History   Socioeconomic History  . Marital status:  Divorced, remarried 11/2017    Spouse name: Not on file  . Number of children: 2  . Years of education: Not on file  . Highest education level: Not on file  Occupational History  .  Management  Tobacco Use  . Smoking status: Former Games developermoker  . Smokeless tobacco: Never Used  Substance and Sexual Activity  . Alcohol use: Yes, whiskey, once a week  . Drug use: No   Current Outpatient Medications on File Prior to Visit  Medication Sig Dispense Refill  . acyclovir (ZOVIRAX) 400 MG tablet Take 400 mg by mouth 5 (five) times daily.    Marland Kitchen. levothyroxine (SYNTHROID, LEVOTHROID) 75 MCG tablet Take 50 mcg by mouth daily before breakfast.     . chlorpheniramine-HYDROcodone (TUSSIONEX PENNKINETIC  ER) 10-8 MG/5ML SUER Take 5 mLs by mouth every 12 (twelve) hours as needed for cough. (Patient not taking: Reported on 12/19/2017) 115 mL 0  . dicyclomine (BENTYL) 10 MG capsule Take 10 mg by mouth 4 (four) times daily -  before meals and at bedtime.    . fexofenadine-pseudoephedrine (ALLEGRA-D ALLERGY & CONGESTION) 180-240 MG 24 hr tablet Take 1 tablet by mouth daily. (Patient not taking: Reported on 12/19/2017) 30 tablet 0  . linaclotide (LINZESS) 145 MCG CAPS capsule Take 145 mcg by  mouth daily before breakfast.    . meloxicam (MOBIC) 15 MG tablet Take 1 tablet (15 mg total) by mouth daily. (Patient not taking: Reported on 12/19/2017) 30 tablet 1  . meloxicam (MOBIC) 7.5 MG tablet Take 7.5 mg by mouth daily.    . ondansetron (ZOFRAN) 4 MG tablet Take 4 mg by mouth every 8 (eight) hours as needed for nausea or vomiting.    Marland Kitchen oseltamivir (TAMIFLU) 75 MG capsule Take 1 capsule (75 mg total) by mouth 2 (two) times daily. (Patient not taking: Reported on 12/19/2017) 10 capsule 0   No current facility-administered medications on file prior to visit.    Allergies  Allergen Reactions  . Prednisone Palpitations   Family History  Problem Relation Age of Onset  . Hyperlipidemia Mother   . Thyroid disease Mother   . Diabetes Father   . Hyperlipidemia Father   . Diabetes Brother   . Hyperlipidemia Brother   . Thyroid disease Brother   . Breast cancer Neg Hx     PE: BP 126/80   Pulse 73   Ht 5\' 2"  (1.575 m)   Wt 151 lb (68.5 kg)   LMP  (LMP Unknown)   SpO2 97%   BMI 27.62 kg/m  Wt Readings from Last 3 Encounters:  12/19/17 151 lb (68.5 kg)  05/05/16 158 lb (71.7 kg)  02/03/16 155 lb (70.3 kg)   Constitutional: overweight, in NAD Eyes: PERRLA, EOMI, no exophthalmos ENT: moist mucous membranes, no thyromegaly, no cervical lymphadenopathy Cardiovascular: RRR, No MRG Respiratory: CTA B Gastrointestinal: abdomen soft, NT, ND, BS+ Musculoskeletal: no deformities, strength intact in all 4 Skin: moist, warm, no rashes Neurological: no tremor with outstretched hands, DTR normal in all 4  ASSESSMENT: 1. Hypothyroidism  2.  Fatigue  PLAN:  1. Patient with relatively recently diagnosed hypothyroidism, approximately 2 years ago, on levothyroxine therapy.  Her recent TFTs have been normal, however, she continues to feel fatigued, to not be able to lose weight, and to have heat and cold intolerance.  We discussed about the fact that she has multiple possible culprits for  her symptoms, including an autoimmune disease (Raynaud's phenomenon), menopause (she had hysterectomy without BSO in her 57s), and also possibly Hashimoto's thyroiditis. -She is now taking levothyroxine at night as she has to take Linzess in the morning.  However, we discussed that she can take levothyroxine first thing in the morning and Linzess 30 minutes later.  She will start doing so, which may give her more energy throughout the day and helpful sleep better at night.  After changing levothyroxine dosing, I advised her to come back in 1.5 months for recheck of her labs. - We discussed about correct intake of levothyroxine, fasting, with water, separated by at least 30 minutes from breakfast, and separated by more than 4 hours from calcium, iron, multivitamins, acid reflux medications (PPIs). - will check thyroid tests today: TSH, free T4 and will also add TPO and ATA antibodies  to screen for Hashimoto's thyroiditis.  We discussed that this is an autoimmune disease which usually causes inflammation in her thyroid.  We discussed about ways to improve her immune system and therefore to decrease the antibody levels.  One option is to also start selenium. -  I will see her back in 4 months  2.  Fatigue -Please also see above -Reviewed her most recent CBC and she is not anemic -We will check her vitamin D and vitamin B12 today along with her TFTs and thyroid antibodies  Component     Latest Ref Rng & Units 12/19/2017  TSH     0.35 - 4.50 uIU/mL 0.73  T4,Free(Direct)     0.60 - 1.60 ng/dL 0.98  Triiodothyronine,Free,Serum     2.3 - 4.2 pg/mL 3.6  Thyroperoxidase Ab SerPl-aCnc     <9 IU/mL 128 (H)  Thyroglobulin Ab     < or = 1 IU/mL <1  Vitamin B12     211 - 911 pg/mL 380  VITD     30.00 - 100.00 ng/mL 22.71 (L)   TFTs normal. Will keep the same dose of LT4 but move this in am, as discussed. New dx of Hashimoto's thyroiditis, which can contribute to her fatigue >> we can try Selenium 200  mcg daily. Also, vit D low >> started vitamin 4000 units daily and recheck at next visit. B12 vitamin level is also on the low side >> may benefit from taking 1000 mcg B12 daily.  Carlus Pavlov, MD PhD [facility] Endocrinology

## 2017-12-19 NOTE — Patient Instructions (Addendum)
Please stop at the lab.  Please continue levothyroxine 88 mcg daily but move it to am. You can drink the coffee with it. Take Linzess 30 min later.   Take the thyroid hormone every day, with water, at least 30 minutes before breakfast, separated by at least 4 hours from: - acid reflux medications - calcium - iron - multivitamins  Please come back for another visit in in 4 months but for labs in 1.5 months.

## 2017-12-20 DIAGNOSIS — E038 Other specified hypothyroidism: Secondary | ICD-10-CM | POA: Insufficient documentation

## 2017-12-20 DIAGNOSIS — R5383 Other fatigue: Secondary | ICD-10-CM | POA: Insufficient documentation

## 2017-12-20 DIAGNOSIS — E063 Autoimmune thyroiditis: Secondary | ICD-10-CM

## 2017-12-20 LAB — THYROID PEROXIDASE ANTIBODY: Thyroperoxidase Ab SerPl-aCnc: 128 IU/mL — ABNORMAL HIGH (ref <9)

## 2017-12-20 LAB — THYROGLOBULIN ANTIBODY

## 2017-12-21 ENCOUNTER — Telehealth: Payer: Self-pay | Admitting: Internal Medicine

## 2017-12-21 NOTE — Telephone Encounter (Signed)
Please advise 

## 2017-12-21 NOTE — Telephone Encounter (Signed)
Pt calling to see where she can get educated with her diagnosis hashimoto.She need a better understanding. Pt also want to know if she can get a prescription for her Vitamin D and stated she need to take 4000 mg. Please advise pt.

## 2017-12-21 NOTE — Telephone Encounter (Signed)
The vitamin D is over-the-counter. Regarding the resources for Hashimoto's thyroiditis, the best one is to go to thyroid.org: http://www.adkins.info/ Ty, C

## 2018-02-01 ENCOUNTER — Other Ambulatory Visit (INDEPENDENT_AMBULATORY_CARE_PROVIDER_SITE_OTHER): Payer: Commercial Managed Care - PPO

## 2018-02-01 DIAGNOSIS — E039 Hypothyroidism, unspecified: Secondary | ICD-10-CM

## 2018-02-01 LAB — T4, FREE: Free T4: 0.98 ng/dL (ref 0.60–1.60)

## 2018-02-01 LAB — TSH: TSH: 0.72 u[IU]/mL (ref 0.35–4.50)

## 2018-02-05 ENCOUNTER — Encounter: Payer: Self-pay | Admitting: Internal Medicine

## 2018-02-05 ENCOUNTER — Telehealth: Payer: Self-pay | Admitting: Internal Medicine

## 2018-02-05 NOTE — Telephone Encounter (Signed)
Please see MyChart message to patient from Dr. Elvera Lennox regarding this.

## 2018-02-05 NOTE — Telephone Encounter (Signed)
Patient stated she would like a call back about her lab results.

## 2018-04-20 ENCOUNTER — Encounter: Payer: Self-pay | Admitting: Internal Medicine

## 2018-04-20 ENCOUNTER — Ambulatory Visit (INDEPENDENT_AMBULATORY_CARE_PROVIDER_SITE_OTHER): Payer: Commercial Managed Care - PPO | Admitting: Internal Medicine

## 2018-04-20 VITALS — BP 120/70 | HR 60 | Ht 62.0 in | Wt 155.0 lb

## 2018-04-20 DIAGNOSIS — E538 Deficiency of other specified B group vitamins: Secondary | ICD-10-CM | POA: Diagnosis not present

## 2018-04-20 DIAGNOSIS — E038 Other specified hypothyroidism: Secondary | ICD-10-CM | POA: Diagnosis not present

## 2018-04-20 DIAGNOSIS — E063 Autoimmune thyroiditis: Secondary | ICD-10-CM | POA: Diagnosis not present

## 2018-04-20 DIAGNOSIS — E559 Vitamin D deficiency, unspecified: Secondary | ICD-10-CM | POA: Diagnosis not present

## 2018-04-20 LAB — VITAMIN B12

## 2018-04-20 LAB — VITAMIN D 25 HYDROXY (VIT D DEFICIENCY, FRACTURES): VITD: 42.41 ng/mL (ref 30.00–100.00)

## 2018-04-20 LAB — T4, FREE: FREE T4: 1.01 ng/dL (ref 0.60–1.60)

## 2018-04-20 LAB — TSH: TSH: 0.84 u[IU]/mL (ref 0.35–4.50)

## 2018-04-20 MED ORDER — LEVOTHYROXINE SODIUM 88 MCG PO TABS: 88.0000 ug | ORAL_TABLET | Freq: Every day | ORAL | 3 refills | Status: DC

## 2018-04-20 MED ORDER — LEVOTHYROXINE SODIUM 88 MCG PO TABS: 88.0000 ug | ORAL_TABLET | Freq: Every day | ORAL | 3 refills | Status: AC

## 2018-04-20 NOTE — Progress Notes (Addendum)
Patient ID: Vanessa Saunders, female   DOB: 12/01/1965, 53 y.o.   MRN: [id number]    HPI  Vanessa Saunders is a 53 y.o.-year-old female, returning for follow-up for hypothyroidism, diagnosed as Hashimoto's hypothyroidism at last visit, also low vitamin D and B12.   Last visi 4 months ago.  Pt. has been dx with hypothyroidism in 2017 during work-up for fatigue, joint pain, exhaustion, weight gain.    She is on levothyroxine 88 mcg daily, taken: - Now in the morning (3 am), previously at night - fasting - at least 30 min from b'fast - no Ca, Fe, MVI - + Omeprazole at night now (prn), previously in the morning - not on Biotin - takes Linzess in the morning - + Mg, Zn, Se, B12, D3  Reviewed patient's TFTs: Lab Results  Component Value Date   TSH 0.72 02/01/2018   TSH 0.73 12/19/2017   FREET4 0.98 02/01/2018   FREET4 1.01 12/19/2017   T3FREE 3.6 12/19/2017  05/02/2017: TSH 0.44 (0.34-5.66) 12/05/2016: TSH 0.706 ... 04/28/2015: TSH 13.87  Her thyroid antibodies were positive at last visit: Component     Latest Ref Rng & Units 12/19/2017  Thyroperoxidase Ab SerPl-aCnc     <9 IU/mL 128 (H)   Lab Results  Component Value Date   THGAB <1 12/19/2017   I recommended to start selenium 200 mcg daily to help decrease the antibody levels.  At last visit she was describing weight gain, fatigue, constipation (she has IBS).  She is now feeling better, with improved fatigue.  She wakes up at 3 AM to exercise on the treadmill for 30 to 40 minutes.  She has to leave the house by 5 AM.  She goes to bed at 7:30 PM.  Pt denies: - feeling nodules in neck - hoarseness - dysphagia - choking - SOB with lying down  She has + FH of thyroid disorders in: Brother and mother. No FH of thyroid cancer. No h/o radiation tx to head or neck.  No herbal supplements. No Biotin use.  No recent steroids use.   Pt. also has a history of Raynaud's phenomenon. FH of RA and dermatomyositis in M, B, celiac  disease. Daughter with TTP.  She is walking at the brisk pace/jogging on her treadmill at home several times a week, in the morning.  She leaves home to get to work at 5 AM.  Due to fatigue, at last visit we checked her vitamin B12 and vitamin D.  Her vitamin D was low, while vitamin B12 was close to the lower limit of normal: Lab Results  Component Value Date   VD25OH 22.71 (L) 12/19/2017  I recommended to start 4000 units vitamin D daily.  Lab Results  Component Value Date   VITAMINB12 380 12/19/2017  I recommended to start 1000 mcg vitamin B12 daily.  However, she saw a weight loss clinic that recommended B12 injections and she started weekly injections-last 3 weeks.  She has 4 injections left.  She was dx'ed with blepharitis since last OV.  ROS: Constitutional: no weight gain/no weight loss, + fatigue, + subjective hyperthermia, + subjective hypothermia Eyes: no blurry vision, no xerophthalmia ENT: no sore throat, + see HPI Cardiovascular: no CP/no SOB/no palpitations/no leg swelling Respiratory: no cough/no SOB/no wheezing Gastrointestinal: no N/no V/no D/+ C/no acid reflux Musculoskeletal: + muscle aches/+ joint aches Skin: no rashes, + hair loss Neurological: no tremors/no numbness/no tingling/no dizziness  I reviewed pt's medications, allergies, PMH, social hx, family hx,  and changes were documented in the history of present illness. Otherwise, unchanged from my initial visit note.  Past Medical History:  Diagnosis Date  . Bone spur RT HAND  . Diverticulitis   . H/O wisdom tooth extraction   . High triglycerides   . HSV-2 (herpes simplex virus 2) infection    Past Surgical History:  Procedure Laterality Date  . ABDOMINAL HYSTERECTOMY  ~2004  . AUGMENTATION MAMMAPLASTY Bilateral 12/2013   after mammo  . COLONOSCOPY WITH PROPOFOL N/A 03/06/2015   Procedure: COLONOSCOPY WITH PROPOFOL;  Surgeon: Christena DeemMartin U Skulskie, MD;  [facility];  Service: Endoscopy;   Laterality: N/A;  . OVARIAN CYST REMOVAL    . PLACEMENT OF BREAST IMPLANTS    . TONSILLECTOMY     Social History   Socioeconomic History  . Marital status:  Divorced, remarried 11/2017    Spouse name: Not on file  . Number of children: 2  . Years of education: Not on file  . Highest education level: Not on file  Occupational History  .  Management  Tobacco Use  . Smoking status: Former Games developermoker  . Smokeless tobacco: Never Used  Substance and Sexual Activity  . Alcohol use: Yes, whiskey, once a week  . Drug use: No   Current Outpatient Medications on File Prior to Visit  Medication Sig Dispense Refill  . acyclovir (ZOVIRAX) 400 MG tablet Take 400 mg by mouth 5 (five) times daily.    . chlorpheniramine-HYDROcodone (TUSSIONEX PENNKINETIC ER) 10-8 MG/5ML SUER Take 5 mLs by mouth every 12 (twelve) hours as needed for cough. (Patient not taking: Reported on 12/19/2017) 115 mL 0  . dicyclomine (BENTYL) 10 MG capsule Take 10 mg by mouth 4 (four) times daily -  before meals and at bedtime.    . fexofenadine-pseudoephedrine (ALLEGRA-D ALLERGY & CONGESTION) 180-240 MG 24 hr tablet Take 1 tablet by mouth daily. (Patient not taking: Reported on 12/19/2017) 30 tablet 0  . levothyroxine (SYNTHROID, LEVOTHROID) 75 MCG tablet Take 50 mcg by mouth daily before breakfast.     . linaclotide (LINZESS) 145 MCG CAPS capsule Take 145 mcg by mouth daily before breakfast.    . meloxicam (MOBIC) 15 MG tablet Take 1 tablet (15 mg total) by mouth daily. (Patient not taking: Reported on 12/19/2017) 30 tablet 1  . meloxicam (MOBIC) 7.5 MG tablet Take 7.5 mg by mouth daily.    . ondansetron (ZOFRAN) 4 MG tablet Take 4 mg by mouth every 8 (eight) hours as needed for nausea or vomiting.    Marland Kitchen. oseltamivir (TAMIFLU) 75 MG capsule Take 1 capsule (75 mg total) by mouth 2 (two) times daily. (Patient not taking: Reported on 12/19/2017) 10 capsule 0   No current facility-administered medications on file prior to visit.     Allergies  Allergen Reactions  . Prednisone Palpitations   Family History  Problem Relation Age of Onset  . Hyperlipidemia Mother   . Thyroid disease Mother   . Diabetes Father   . Hyperlipidemia Father   . Diabetes Brother   . Hyperlipidemia Brother   . Thyroid disease Brother   . Breast cancer Neg Hx     PE: BP 120/70   Pulse 60   Ht 5\' 2"  (1.575 m) Comment: measured  Wt 155 lb (70.3 kg)   LMP  (LMP Unknown)   SpO2 97%   BMI 28.35 kg/m  Wt Readings from Last 3 Encounters:  04/20/18 155 lb (70.3 kg)  12/19/17 151 lb (68.5 kg)  05/05/16  158 lb (71.7 kg)   Constitutional: normal weight, in NAD Eyes: PERRLA, EOMI, no exophthalmos ENT: moist mucous membranes, no thyromegaly, no cervical lymphadenopathy Cardiovascular: RRR, No MRG Respiratory: CTA B Gastrointestinal: abdomen soft, NT, ND, BS+ Musculoskeletal: no deformities, strength intact in all 4 Skin: moist, warm, no rashes Neurological: no tremor with outstretched hands, DTR normal in all 4  ASSESSMENT: 1. Hypothyroidism  2. Vitamin D insufficiency  3. Low B12  PLAN:  1. Patient with history of hypothyroidism, diagnosed approximately 2.5 years ago, on levothyroxine therapy.  At last visit we checked her thyroid antibodies and they returned positive, giving her a diagnosis of Hashimoto's thyroiditis.  I suggested to start selenium to help lower the antibodies -At last visit, her TFTs returned normal blood she was still feeling fatigued.  I suggested to move levothyroxine in the morning since she was taking it at night as she had to take Linzess in the morning.  We discussed that this may give her more energy during the day and could help with sleeping at night.  She is indeed feeling better, with still some fatigue, but overall more energy. - latest thyroid labs reviewed with pt >> normal 01/2018 - she continues on LT4 88 mcg daily - pt feels good on this dose. - we discussed about taking the thyroid hormone  every day, with water, >30 minutes before breakfast, separated by >4 hours from acid reflux medications, calcium, iron, multivitamins. Pt. is taking it correctly now, moved to a.m. since last visit. - will check thyroid tests today: TSH and fT4 - If labs are abnormal, she will need to return for repeat TFTs in 1.5 months  2. Vitamin D insufficiency -We checked a vitamin D level due to her complaint of fatigue at last visit >> vitamin D was low, 22.71 -We started 4000 units vitamin D daily, which she continues today -We will check another level today  3. Low B12 -At last visit, B12 was low in the normal range and I advised her to take 1000 mcg B12 daily. However, she started B12 im inj's 3 weeks ago >> once a week. -We will recheck a level today, but we discussed about spacing the injections out to once a month until she runs out and then to switch to 1000 mcg p.o. B12 daily  Needs refills.  Office Visit on 04/20/2018  Component Date Value Ref Range Status  . TSH 04/20/2018 0.84  0.35 - 4.50 uIU/mL Final  . Free T4 04/20/2018 1.01  0.60 - 1.60 ng/dL Final   Comment: Specimens from patients who are undergoing biotin therapy and /or ingesting biotin supplements may contain high levels of biotin.  The higher biotin concentration in these specimens interferes with this Free T4 assay.  Specimens that contain high levels  of biotin may cause false high results for this Free T4 assay.  Please interpret results in light of the total clinical presentation of the patient.    Marland Kitchen VITD 04/20/2018 42.41  30.00 - 100.00 ng/mL Final  . Vitamin B-12 04/20/2018 >1525* 211 - 911 pg/mL Final   Msg sent: Dear Ms. Ambrocio, The thyroid antibodies are not back yet, however, the rest of the labs are.  He was thyroid tests and vitamin D are normal.  Please continue the current doses of levothyroxine and vitamin D.  The B12 level is very high, so let us continue with the plan to space out the injections and then  continue with oral B12 as discussed. Sincerely, Silvestre Mesi  Irvin Bastin MD  Component     Latest Ref Rng & Units 04/20/2018  Thyroperoxidase Ab SerPl-aCnc     <9 IU/mL 193 (H)  Vitamin B12     211 - 911 pg/mL >1525 (H)  Thyroid antibodies are higher than before. She does take selenium.  Carlus Pavlov, MD PhD [facility]

## 2018-04-20 NOTE — Patient Instructions (Signed)
Please stop at the lab.  Continue Levothyroxine 88 mcg daily.  Take the thyroid hormone every day, with water, at least 30 minutes before breakfast, separated by at least 4 hours from: - acid reflux medications - calcium - iron - multivitamins  After the next B12 injection, space them once a month until you run out, then start 1000 mcg daily by mouth.  Continue vitamin D 4000 units daily.  Please come back for a follow-up appointment in 1 year.

## 2018-04-23 LAB — THYROID PEROXIDASE ANTIBODY: Thyroperoxidase Ab SerPl-aCnc: 193 IU/mL — ABNORMAL HIGH (ref <9)

## 2018-04-27 DIAGNOSIS — E063 Autoimmune thyroiditis: Secondary | ICD-10-CM | POA: Diagnosis not present

## 2018-05-03 IMAGING — MG MM DIGITAL SCREENING IMPLANTS W/ CAD
8 series · 8 of 8 positions shown · non-contrast
Comparison: Previous exam(s).

CLINICAL DATA: Screening.

EXAM:
DIGITAL SCREENING BILATERAL MAMMOGRAM WITH IMPLANTS AND CAD
The patient has new retro pectoral silicone implants. Standard and
implant displaced views were performed.

[L MLO (1 of 2)]
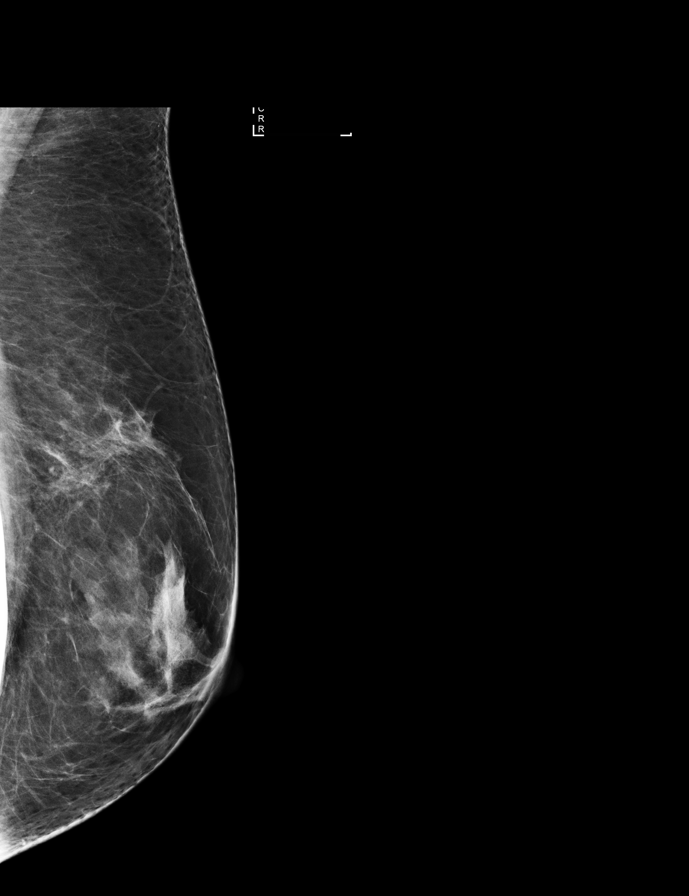

[L MLO (2 of 2)]
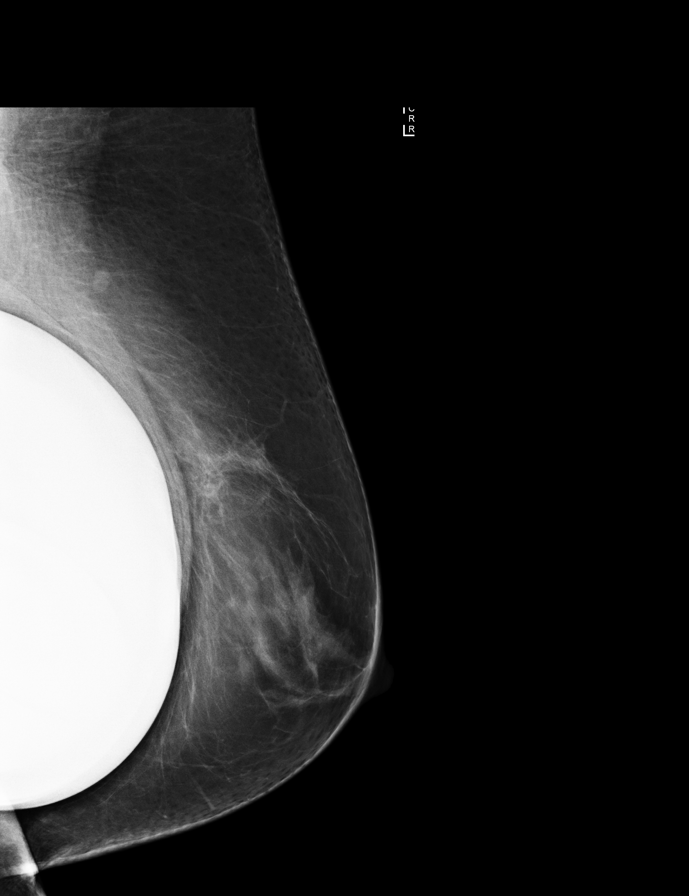

[R MLO (1 of 2)]
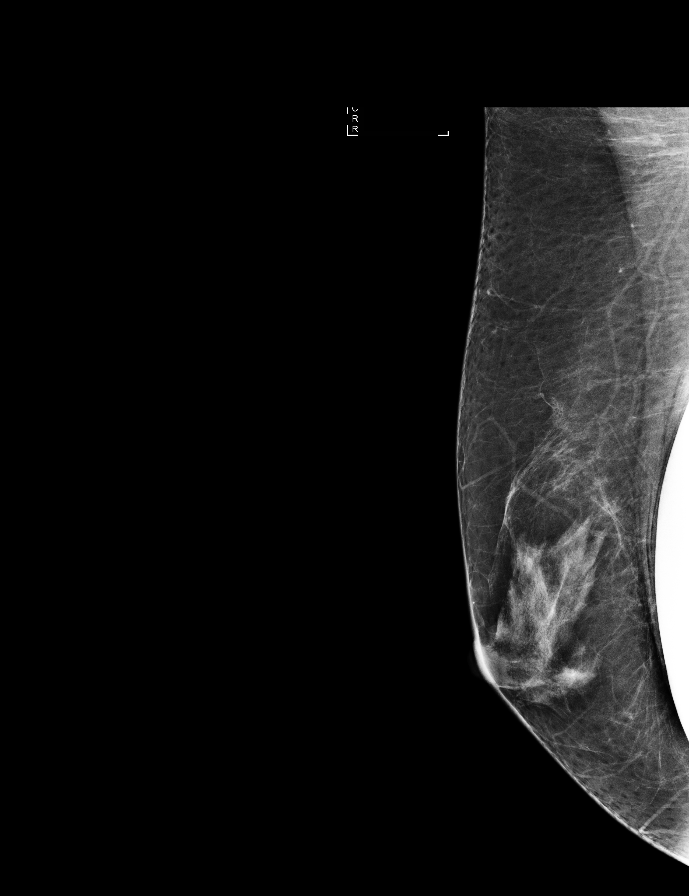

[R MLO (2 of 2)]
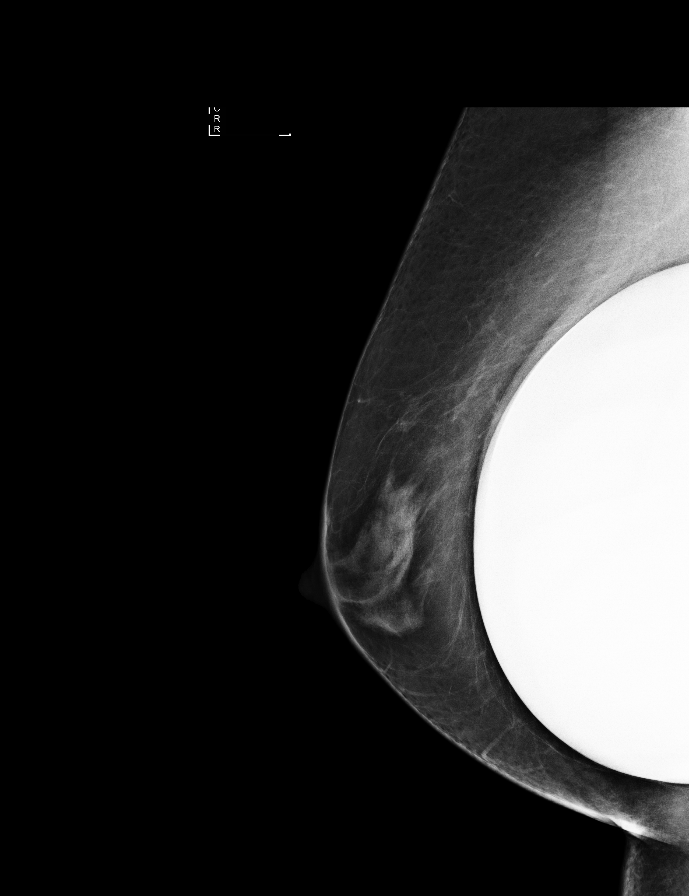

[L CC (1 of 2)]
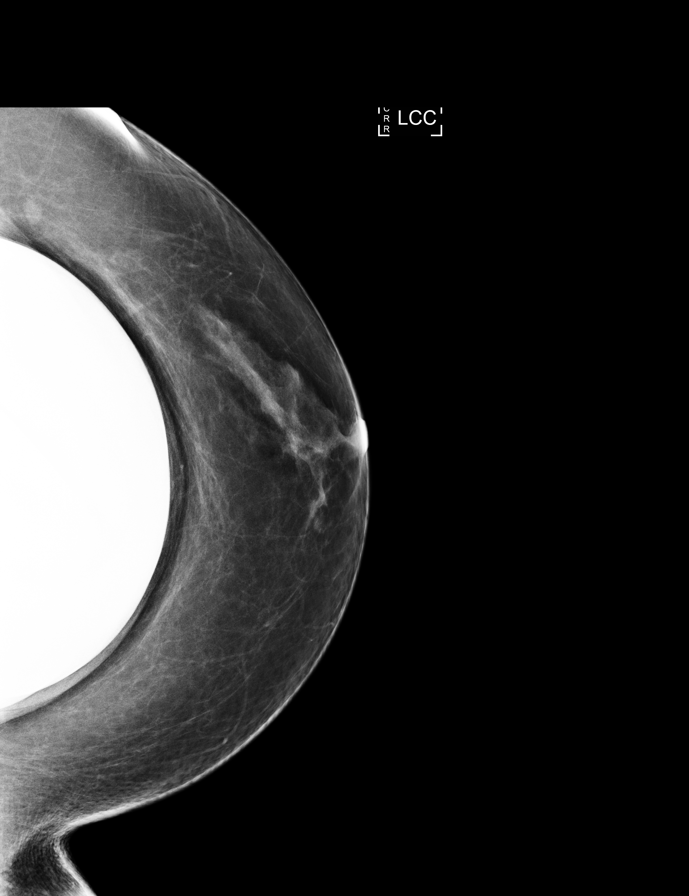

[L CC (2 of 2)]
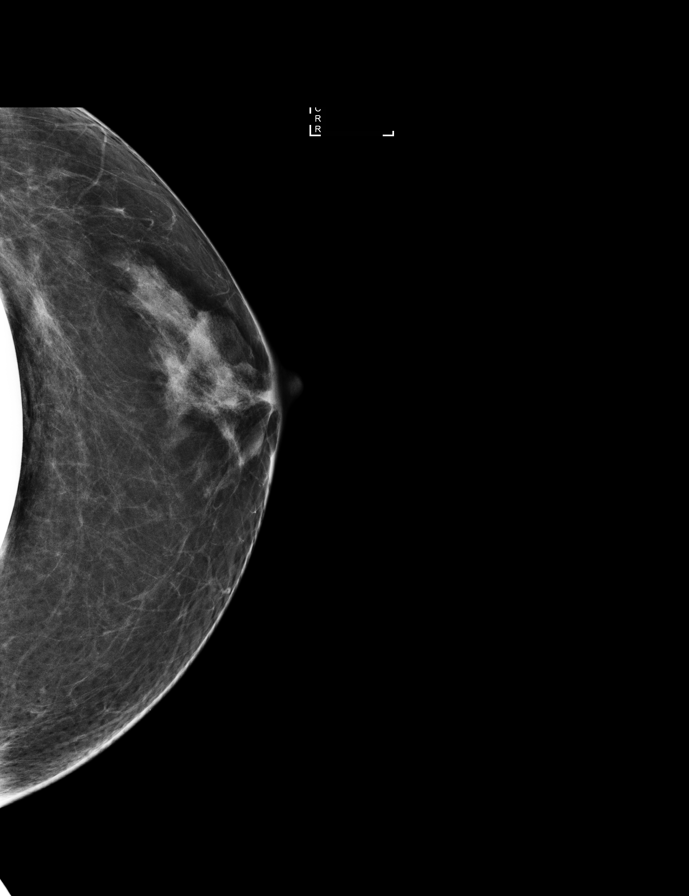

[R CC (1 of 2)]
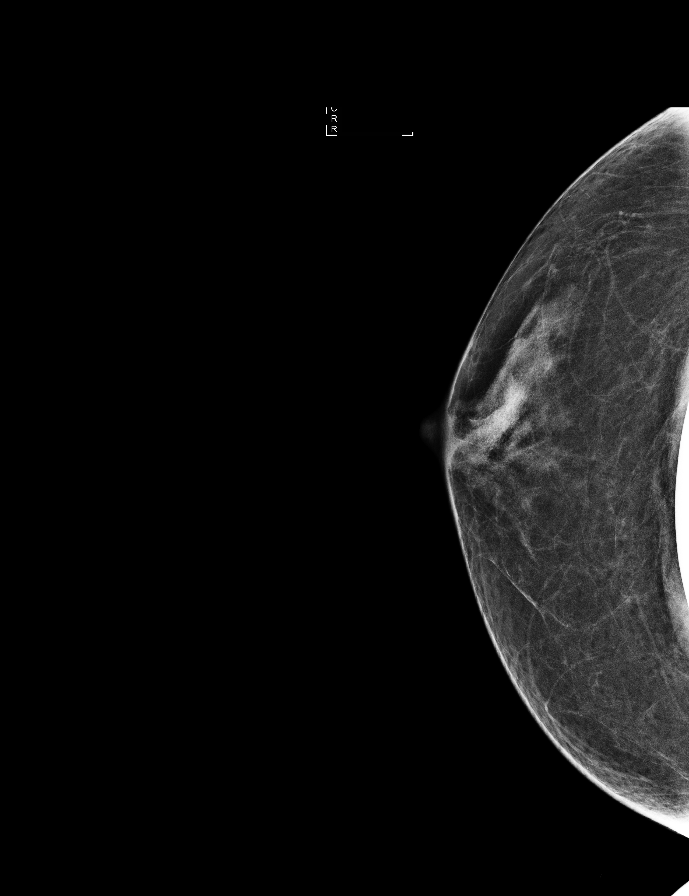

[R CC (2 of 2)]
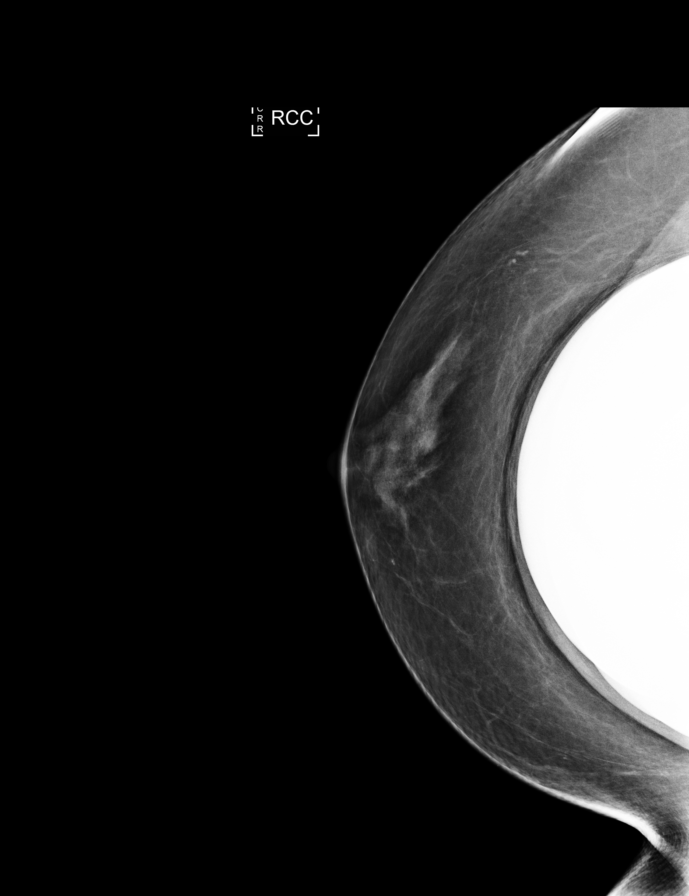

[8 of 8 positions shown; findings below may reference images not displayed]

ACR Breast Density Category c: The breast tissue is heterogeneously
dense, which may obscure small masses.
FINDINGS: There are no findings suspicious for malignancy. Images were
processed with CAD.
IMPRESSION: No mammographic evidence of malignancy. A result letter of this
screening mammogram will be mailed directly to the patient.

RECOMMENDATION:
Screening mammogram in one year. (Code:ZR-H-CB1)

BI-RADS CATEGORY  1:  Negative.

## 2018-05-21 DIAGNOSIS — I7300 Raynaud's syndrome without gangrene: Secondary | ICD-10-CM | POA: Diagnosis not present

## 2018-05-21 DIAGNOSIS — E039 Hypothyroidism, unspecified: Secondary | ICD-10-CM | POA: Diagnosis not present

## 2018-05-21 DIAGNOSIS — R768 Other specified abnormal immunological findings in serum: Secondary | ICD-10-CM | POA: Diagnosis not present

## 2018-07-24 DIAGNOSIS — K5790 Diverticulosis of intestine, part unspecified, without perforation or abscess without bleeding: Secondary | ICD-10-CM | POA: Diagnosis not present

## 2018-07-24 DIAGNOSIS — Z86010 Personal history of colonic polyps: Secondary | ICD-10-CM | POA: Diagnosis not present

## 2018-07-24 DIAGNOSIS — K5904 Chronic idiopathic constipation: Secondary | ICD-10-CM | POA: Diagnosis not present

## 2018-08-09 DIAGNOSIS — E559 Vitamin D deficiency, unspecified: Secondary | ICD-10-CM | POA: Diagnosis not present

## 2018-08-09 DIAGNOSIS — E038 Other specified hypothyroidism: Secondary | ICD-10-CM | POA: Diagnosis not present

## 2018-08-09 DIAGNOSIS — I7300 Raynaud's syndrome without gangrene: Secondary | ICD-10-CM | POA: Insufficient documentation

## 2018-08-09 DIAGNOSIS — K581 Irritable bowel syndrome with constipation: Secondary | ICD-10-CM | POA: Insufficient documentation

## 2018-08-09 DIAGNOSIS — E063 Autoimmune thyroiditis: Secondary | ICD-10-CM | POA: Diagnosis not present

## 2018-08-31 IMAGING — RF DG BE LIMITED  W/ CM
2 series · 7 of 10 positions shown · non-contrast
Comparison: CT scan of the abdomen pelvis December 31, 2014.

CLINICAL DATA: Diverticulitis, pre surgical planning.

EXAM:
SINGLE COLUMN BARIUM ENEMA
TECHNIQUE: Initial scout AP supine abdominal image obtained to insure adequate
colon cleansing. Barium was introduced into the colon in a
retrograde fashion and refluxed from the rectum to the cecum. Spot
images of the colon followed by overhead radiographs were obtained.
FLUOROSCOPY TIME:  Fluoroscopy Time:  2 minutes, 0 seconds
Radiation Exposure Index (if provided by the fluoroscopic device):
1839.25 micro Gy per meter squared
Number of Acquired Spot Images: 8 + 8 overhead radiographs

[Series 1: t abdomen supine · 0.14mm/px · 5 of 8 slices shown]
[im 1/8]
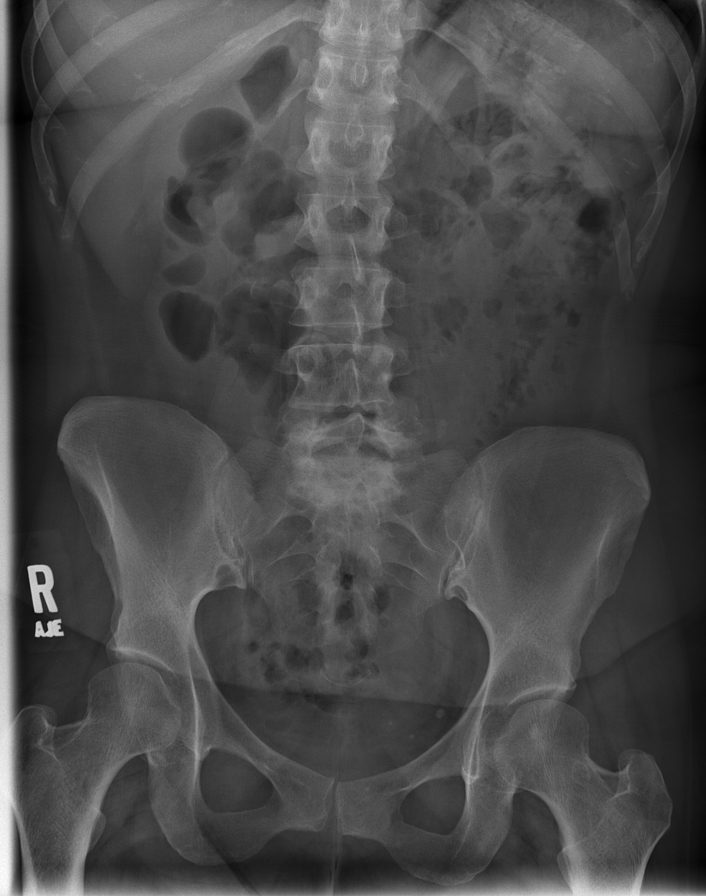
[im 2/8]
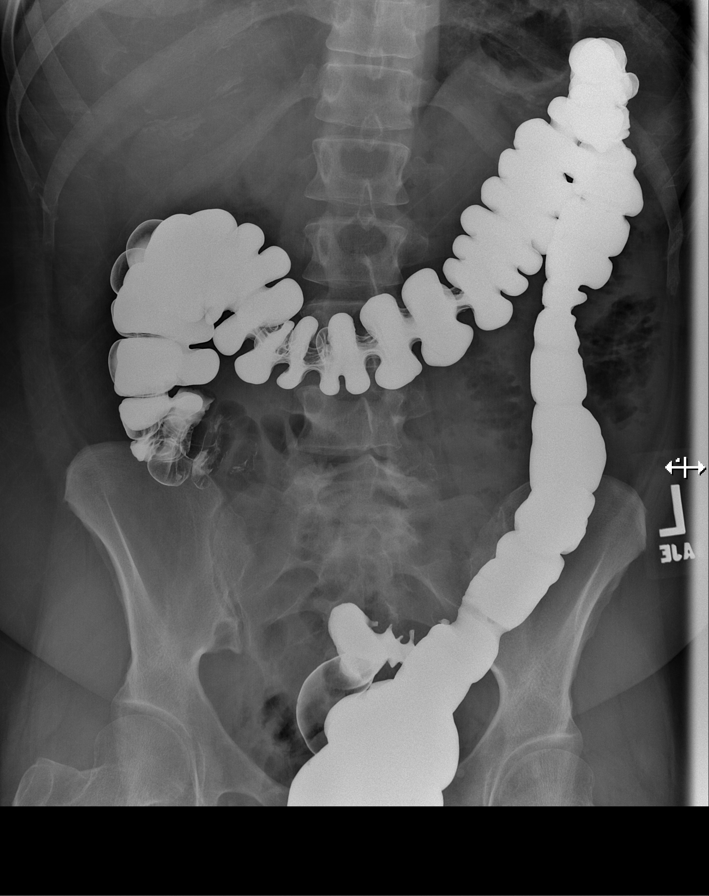
[im 4/8]
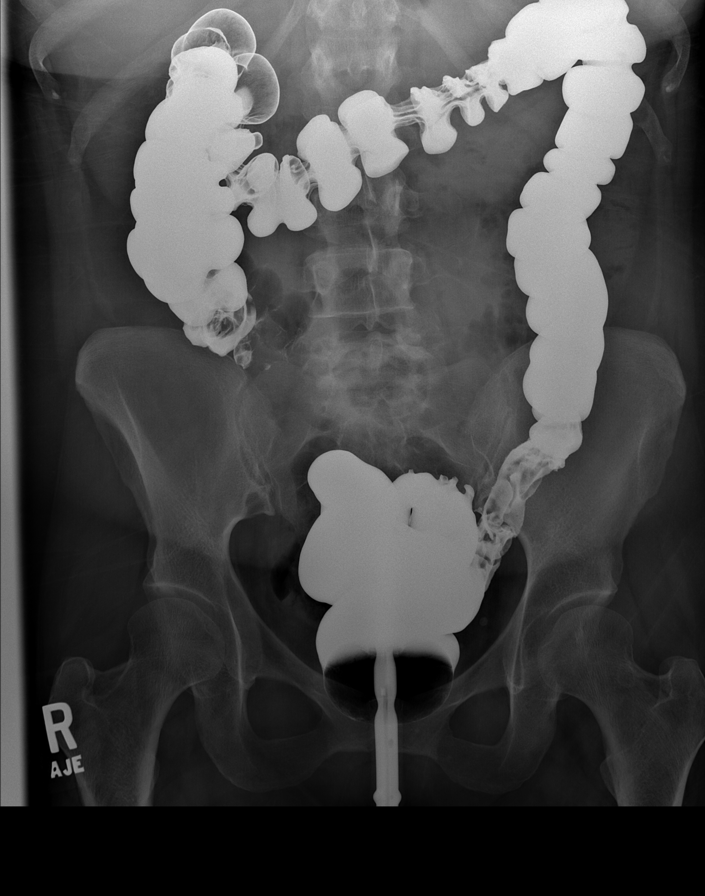
[im 6/8]
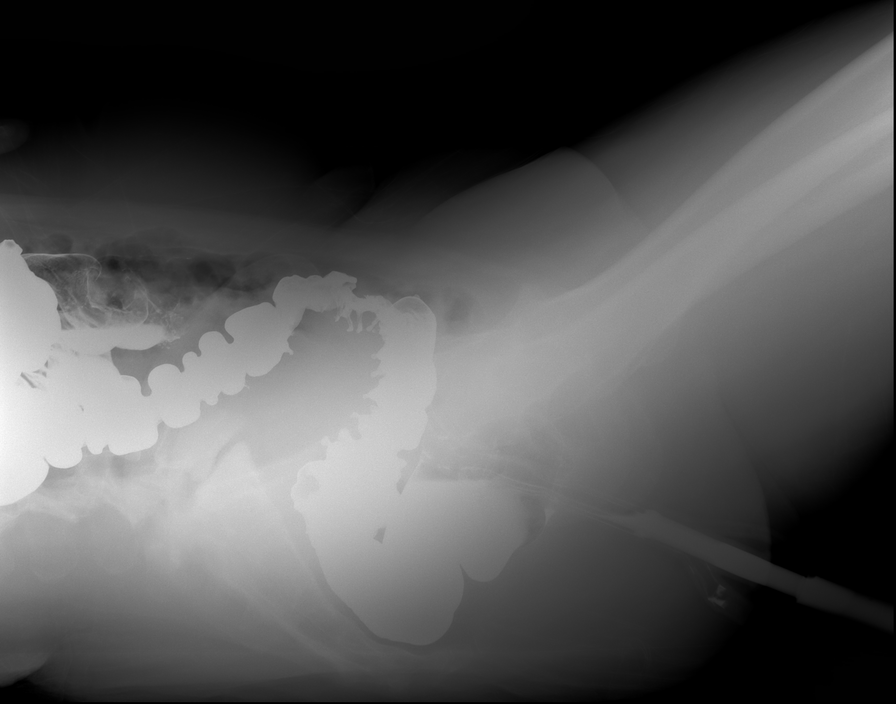
[im 8/8]
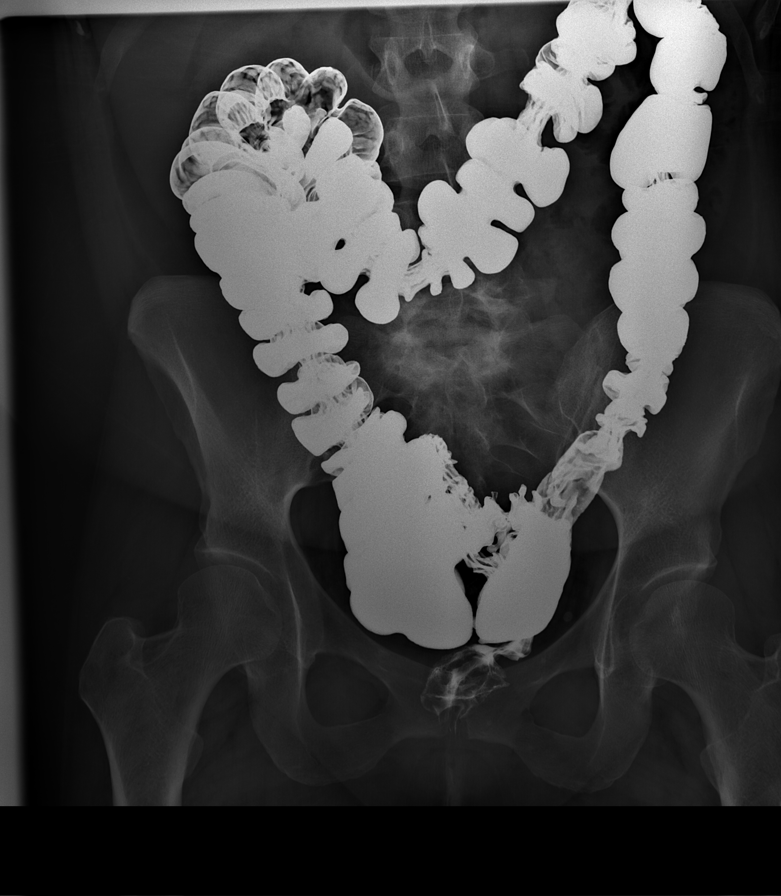

[Series 2: fluoro_barium singleshot_bb · 0.18mm/px · 2 of 8 slices shown]
[im 1/8]
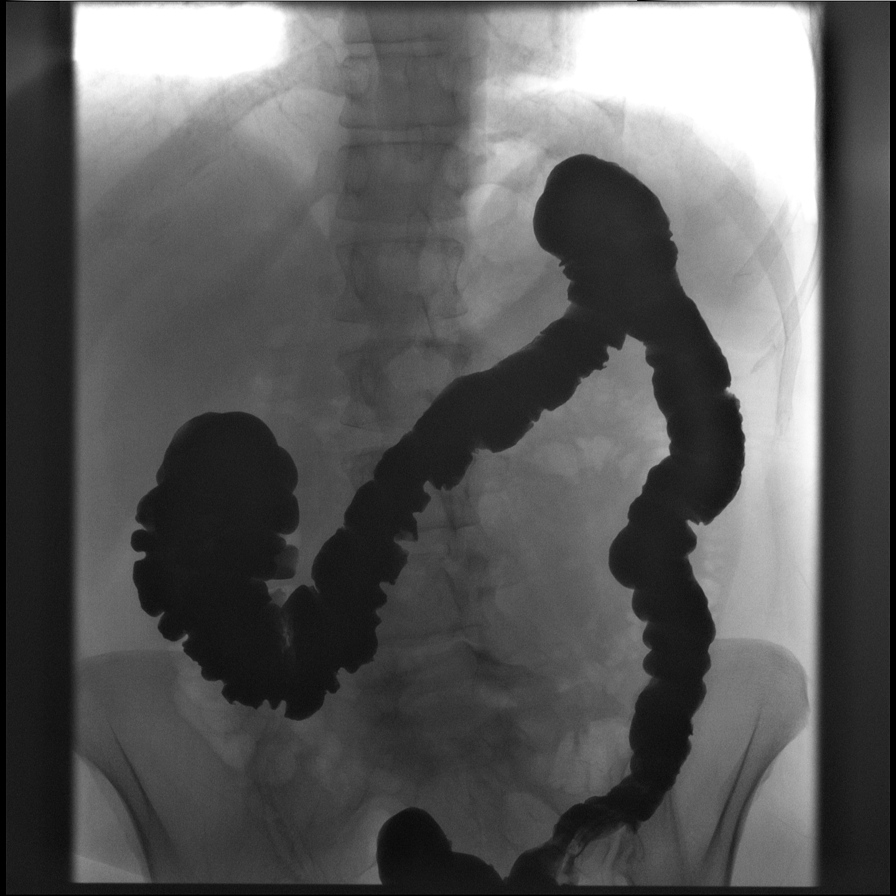
[im 2/8]
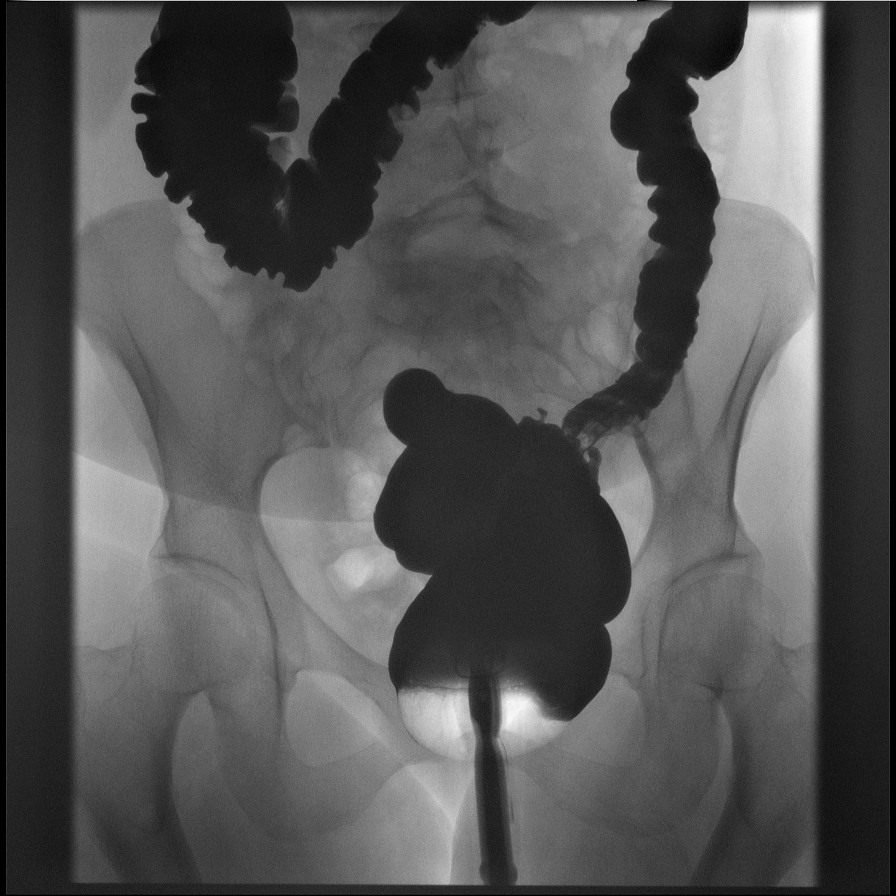

[7 of 10 positions shown; findings below may reference images not displayed]

FINDINGS: The patient reports being unable to complete the prep having had
nausea and vomiting over night. Enema was performed this morning
revealed no formed stool but persistent mucus. The scout film
revealed a moderate amount of retained stool in the left colon an
air in the right colon. A call was made to Dr. Sac are esophagus and
process permission received to perform a single contrast study.

The rectal balloon was inflated under fluoroscopic guidance. Barium
was then instilled into the rectum and it flowed retrograde to the
right colon. Scattered diverticular were noted throughout the
sigmoid colon. No significant diverticulosis was noted elsewhere in
the colon. An air block phenomenon and initially prevented filling
of the right colon. On subsequent images however the right colon did
fill. No filling of the appendix was observed. No annular can
treating lesions were demonstrated. No polypoid masses were
demonstrated but the study was not tailored for same.
IMPRESSION: Sigmoid diverticulosis without evidence of diverticulitis. No
significant diverticulosis elsewhere within the colon.

## 2018-11-12 ENCOUNTER — Other Ambulatory Visit: Payer: Self-pay

## 2018-11-12 ENCOUNTER — Ambulatory Visit (INDEPENDENT_AMBULATORY_CARE_PROVIDER_SITE_OTHER): Payer: Commercial Managed Care - PPO

## 2018-11-12 ENCOUNTER — Encounter: Payer: Self-pay | Admitting: Podiatry

## 2018-11-12 ENCOUNTER — Ambulatory Visit (INDEPENDENT_AMBULATORY_CARE_PROVIDER_SITE_OTHER): Payer: Commercial Managed Care - PPO | Admitting: Podiatry

## 2018-11-12 VITALS — Temp 98.2°F

## 2018-11-12 DIAGNOSIS — M79672 Pain in left foot: Secondary | ICD-10-CM

## 2018-11-12 DIAGNOSIS — B009 Herpesviral infection, unspecified: Secondary | ICD-10-CM | POA: Insufficient documentation

## 2018-11-12 DIAGNOSIS — M722 Plantar fascial fibromatosis: Secondary | ICD-10-CM

## 2018-11-12 NOTE — Patient Instructions (Signed)

## 2018-11-13 ENCOUNTER — Telehealth: Payer: Self-pay | Admitting: *Deleted

## 2018-11-13 ENCOUNTER — Encounter: Payer: Self-pay | Admitting: Podiatry

## 2018-11-13 MED ORDER — MELOXICAM 15 MG PO TABS: 15.0000 mg | ORAL_TABLET | Freq: Every day | ORAL | 0 refills | Status: DC

## 2018-11-13 NOTE — Telephone Encounter (Signed)
I have the ice packs in the bag and sat them at the front desk so patient could pick up at 5 pm today. Lattie Haw

## 2018-11-19 NOTE — Progress Notes (Signed)
Subjective:   Patient ID: Vanessa Saunders, female   DOB: 53 y.o.   MRN: [id number]   HPI 53 year old female presents the office today for concerns of left heel pain which is been ongoing for greater than 3 months.  She says it hurts more in the heel area.  Hurts in the morning when she first gets up after being on her feet all day.  No recent injury.  Gets some occasional swelling.  No redness or warmth.  No other treatment.  She does have a history of Raynaud's disease.  This been ongoing last 3 to 4 years.   Review of Systems  All other systems reviewed and are negative.  Past Medical History:  Diagnosis Date  . Bone spur RT HAND  . Diverticulitis   . H/O wisdom tooth extraction   . High triglycerides   . HSV-2 (herpes simplex virus 2) infection     Past Surgical History:  Procedure Laterality Date  . ABDOMINAL HYSTERECTOMY    . AUGMENTATION MAMMAPLASTY Bilateral 12/2013   after mammo  . COLONOSCOPY WITH PROPOFOL N/A 03/06/2015   Procedure: COLONOSCOPY WITH PROPOFOL;  Surgeon: Lollie Sails, MD;  [facility];  Service: Endoscopy;  Laterality: N/A;  . OVARIAN CYST REMOVAL    . PLACEMENT OF BREAST IMPLANTS    . TONSILLECTOMY       Current Outpatient Medications:  Marland Kitchen  Multiple Vitamin (MULTIVITAMIN) tablet, Take 1 tablet by mouth daily., Disp: , Rfl:  .  acyclovir (ZOVIRAX) 400 MG tablet, Take 400 mg by mouth 5 (five) times daily., Disp: , Rfl:  .  levothyroxine (SYNTHROID, LEVOTHROID) 88 MCG tablet, Take 1 tablet (88 mcg total) by mouth daily., Disp: 90 tablet, Rfl: 3 .  linaclotide (LINZESS) 145 MCG CAPS capsule, Take 145 mcg by mouth daily before breakfast., Disp: , Rfl:  .  meloxicam (MOBIC) 15 MG tablet, Take 1 tablet (15 mg total) by mouth daily., Disp: 30 tablet, Rfl: 0  Allergies  Allergen Reactions  . Prednisone Palpitations         Objective:  Physical Exam  General: AAO x3, NAD  Dermatological: Skin is warm, dry and supple bilateral.  Nails x 10 are well manicured; remaining integument appears unremarkable at this time. There are no open sores, no preulcerative lesions, no rash or signs of infection present.  Vascular: Dorsalis Pedis artery and Posterior Tibial artery pedal pulses are 2/4 bilateral with immedate capillary fill time. Pedal hair growth present. No varicosities and no lower extremity edema present bilateral. There is no pain with calf compression, swelling, warmth, erythema.   Neruologic: Grossly intact via light touch bilateral. Vibratory intact via tuning fork bilateral. Protective threshold with Semmes Wienstein monofilament intact to all pedal sites bilateral. Negative tinel sign.   Musculoskeletal: Tenderness to palpation along the plantar medial tubercle of the calcaneus at the insertion of plantar fascia on the left foot. There is no pain along the course of the plantar fascia within the arch of the foot. Plantar fascia appears to be intact. There is no pain with lateral compression of the calcaneus or pain with vibratory sensation. There is no pain along the course or insertion of the achilles tendon. No other areas of tenderness to bilateral lower extremities.Muscular strength 5/5 in all groups tested bilateral.  Gait: Unassisted, Nonantalgic.       Assessment:   Left heel pain, plan fasciitis     Plan:  -Treatment options discussed including all alternatives, risks, and complications -Etiology of  symptoms were discussed -X-rays were obtained and reviewed with the patient. No evidence of acute fracture or stress fracture. -Steroid injection performed.  See procedure note below -Plantar fascial brace -Stretching, icing daily. -Plantar fascial tapings applied -Shoe modifications and orthotics discussed  Procedure: Injection Tendon/Ligament Discussed alternatives, risks, complications and verbal consent was obtained.  Location: Left plantar fascia at the glabrous junction; medial approach. Skin  Prep: Alcohol  Injectate: 0.5cc 0.5% marcaine plain, 0.5 cc 2% lidocaine plain and, 1 cc kenalog 10. Disposition: Patient tolerated procedure well. Injection site dressed with a band-aid.  Post-injection care was discussed and return precautions discussed.   Return in about 3 weeks (around 12/03/2018).  Vivi BarrackMatthew R Wagoner DPM

## 2018-11-30 ENCOUNTER — Ambulatory Visit: Payer: Commercial Managed Care - PPO | Admitting: Podiatry

## 2018-11-30 ENCOUNTER — Other Ambulatory Visit: Payer: Commercial Managed Care - PPO | Admitting: Orthotics

## 2018-12-04 ENCOUNTER — Ambulatory Visit (INDEPENDENT_AMBULATORY_CARE_PROVIDER_SITE_OTHER): Payer: Commercial Managed Care - PPO | Admitting: Podiatry

## 2018-12-04 ENCOUNTER — Ambulatory Visit (INDEPENDENT_AMBULATORY_CARE_PROVIDER_SITE_OTHER): Payer: Commercial Managed Care - PPO | Admitting: Orthotics

## 2018-12-04 ENCOUNTER — Encounter: Payer: Self-pay | Admitting: Podiatry

## 2018-12-04 ENCOUNTER — Other Ambulatory Visit: Payer: Self-pay

## 2018-12-04 DIAGNOSIS — M722 Plantar fascial fibromatosis: Secondary | ICD-10-CM | POA: Diagnosis not present

## 2018-12-04 DIAGNOSIS — M79672 Pain in left foot: Secondary | ICD-10-CM

## 2018-12-04 NOTE — Patient Instructions (Signed)

## 2018-12-04 NOTE — Progress Notes (Signed)

## 2018-12-06 NOTE — Progress Notes (Signed)
Subjective: 53 year old female presents the office today for evaluation of her fasciitis.  She states that she still having discomfort but overall she is feeling better.  Injections have been helpful.  She gets pain more at the end of the day and after working.  She states that when she is not at work and she was hiking her shoes are more supportive and feel better. Denies any systemic complaints such as fevers, chills, nausea, vomiting. No acute changes since last appointment, and no other complaints at this time.   Objective: AAO x3, NAD DP/PT pulses palpable bilaterally, CRT less than 3 seconds There is still mild tenderness palpation on plantar medial tubercle of the calcaneus at the insertion of the plantar fascia but overall plantar fascia appears to be intact.  No pain about the first the calcaneus no pain with psoas tendon.  No other areas of tenderness are identified.  No edema, erythema. No open lesions or pre-ulcerative lesions.  No pain with calf compression, swelling, warmth, erythema  Assessment: Bilateral plantar fasciitis  L>R, improving  Plan: -All treatment options discussed with the patient including all alternatives, risks, complications.  -She was to hold off another steroid injection today.  We discussed continued anti-inflammatories, stretching, icing exercises daily.  She is going to be measured for orthotics today. -Patient encouraged to call the office with any questions, concerns, change in symptoms.   Return in about 3 weeks (around 12/25/2018).  Trula Slade DPM

## 2018-12-19 ENCOUNTER — Other Ambulatory Visit: Payer: Self-pay | Admitting: Podiatry

## 2018-12-25 ENCOUNTER — Ambulatory Visit: Payer: Commercial Managed Care - PPO | Admitting: Orthotics

## 2018-12-25 ENCOUNTER — Other Ambulatory Visit: Payer: Self-pay

## 2018-12-25 DIAGNOSIS — M79672 Pain in left foot: Secondary | ICD-10-CM

## 2018-12-25 DIAGNOSIS — M722 Plantar fascial fibromatosis: Secondary | ICD-10-CM

## 2018-12-25 NOTE — Progress Notes (Signed)
Patient came in today to pick up custom made foot orthotics.  The goals were accomplished and the patient reported no dissatisfaction with said orthotics.  Patient was advised of breakin period and how to report any issues. 

## 2019-02-02 ENCOUNTER — Other Ambulatory Visit: Payer: Self-pay | Admitting: Podiatry

## 2019-03-28 ENCOUNTER — Encounter: Payer: Self-pay | Admitting: Internal Medicine

## 2019-04-23 ENCOUNTER — Ambulatory Visit: Payer: Commercial Managed Care - PPO | Admitting: Internal Medicine

## 2020-04-24 ENCOUNTER — Other Ambulatory Visit: Payer: Self-pay

## 2020-04-24 ENCOUNTER — Ambulatory Visit (INDEPENDENT_AMBULATORY_CARE_PROVIDER_SITE_OTHER): Payer: Commercial Managed Care - PPO | Admitting: Podiatry

## 2020-04-24 DIAGNOSIS — M722 Plantar fascial fibromatosis: Secondary | ICD-10-CM

## 2020-04-24 MED ORDER — NONFORMULARY OR COMPOUNDED ITEM: 3 refills | Status: AC

## 2020-04-24 NOTE — Patient Instructions (Signed)

## 2020-04-26 DIAGNOSIS — M722 Plantar fascial fibromatosis: Secondary | ICD-10-CM | POA: Insufficient documentation

## 2020-04-26 NOTE — Progress Notes (Signed)
Subjective: 55 year old female presents the office with concerns of a "knot" in the arch of the right foot.  She states that she noticed some occasional discomfort particular if she does not stretch.  Overall her heels been feeling better.  She denies recent injury or falls or changes otherwise.  No swelling.  She has not noticed any "knot" becoming larger at all.  Stretching seems to help.  Denies any systemic complaints such as fevers, chills, nausea, vomiting. No acute changes since last appointment, and no other complaints at this time.   Objective: AAO x3, NAD DP/PT pulses palpable bilaterally, CRT less than 3 seconds On the medial band plantar fashion the arch of the foot of the right side is a small firm nonmobile soft tissue mass consistent with plantar fibroma.  Minimal tenderness palpation.  No edema, erythema.  There is no pain on the insertion of plantar fashion the arch of the foot bilaterally.  MMT 5/5. No pain with calf compression, swelling, warmth, erythema  Assessment: Plantar fibroma right foot  Plan: -All treatment options discussed with the patient including all alternatives, risks, complications.  -Continue stretching, icing daily.  I ordered a compound cream today through [facility] to include verapamil.  Continue arch supports and supportive shoes.  If it is larger will consider steroid injection. -Patient encouraged to call the office with any questions, concerns, change in symptoms.   Vivi Barrack DPM

## 2021-01-05 ENCOUNTER — Telehealth: Payer: Self-pay | Admitting: Podiatry

## 2021-01-05 DIAGNOSIS — M722 Plantar fascial fibromatosis: Secondary | ICD-10-CM | POA: Diagnosis not present

## 2021-01-05 NOTE — Telephone Encounter (Signed)
Pt left message asking for a call back about getting another pair of orthotics.  I returned call and pt wants to get another pair of orthotics and was seen this year. I asked pt if she wanted them just like the pair she got in 2020 and she said yes that they are working for her. I told pt I would get them ordered and bill her insurance. 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 I told pt they would be billed today and I would call when they come in for pt to pick them up.

## 2021-01-20 ENCOUNTER — Telehealth: Payer: Self-pay | Admitting: Podiatry

## 2021-01-20 NOTE — Telephone Encounter (Signed)
2nd pr orthotics in.. pt aware ok to pick up. °

## 2022-11-03 ENCOUNTER — Ambulatory Visit (INDEPENDENT_AMBULATORY_CARE_PROVIDER_SITE_OTHER): Admitting: Podiatry

## 2022-11-03 DIAGNOSIS — Q666 Other congenital valgus deformities of feet: Secondary | ICD-10-CM | POA: Diagnosis not present

## 2022-11-03 NOTE — Progress Notes (Signed)
  Subjective:  Patient ID: Vanessa Saunders, female    DOB: [birth year],  MRN: [id number]  Chief Complaint  Patient presents with   Foot Orthotics    57 y.o. female presents with the above complaint.  Patient presents with complaint of bilateral flatfoot deformity.  Patient states a previous pair of orthotics to start to wear out she would like to do another pair of orthotics.  Overall her feet are doing great.  Denies any other acute complaints.   Review of Systems: Negative except as noted in the HPI. Denies N/V/F/Ch.  Past Medical History:  Diagnosis Date   Bone spur RT HAND   Diverticulitis    H/O wisdom tooth extraction    High triglycerides    HSV-2 (herpes simplex virus 2) infection     Current Outpatient Medications:    acyclovir (ZOVIRAX) 400 MG tablet, Take 400 mg by mouth 5 (five) times daily., Disp: , Rfl:    levothyroxine (SYNTHROID, LEVOTHROID) 88 MCG tablet, Take 1 tablet (88 mcg total) by mouth daily., Disp: 90 tablet, Rfl: 3   linaclotide (LINZESS) 145 MCG CAPS capsule, Take 145 mcg by mouth daily before breakfast., Disp: , Rfl:    meloxicam (MOBIC) 15 MG tablet, TAKE 1 TABLET BY MOUTH EVERY DAY, Disp: 30 tablet, Rfl: 0   Multiple Vitamin (MULTIVITAMIN) tablet, Take 1 tablet by mouth daily., Disp: , Rfl:    NONFORMULARY OR COMPOUNDED ITEM, Scar cream : Verapamil 10%, Pentoxifyline 5% Order faxed to [facility], Disp: 1 each, Rfl: 3   thyroid (ARMOUR) 30 MG tablet, Take two tabs po daily on Monday thru Saturday and one tab on sunday, Disp: , Rfl:   Social History   Tobacco Use  Smoking Status Former  Smokeless Tobacco Never    Allergies  Allergen Reactions   Prednisone Palpitations   Objective:  There were no vitals filed for this visit. There is no height or weight on file to calculate BMI. Constitutional Well developed. Well nourished.  Vascular Dorsalis pedis pulses palpable bilaterally. Posterior tibial pulses palpable bilaterally. Capillary  refill normal to all digits.  No cyanosis or clubbing noted. Pedal hair growth normal.  Neurologic Normal speech. Oriented to person, place, and time. Epicritic sensation to light touch grossly present bilaterally.  Dermatologic Nails well groomed and normal in appearance. No open wounds. No skin lesions.  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  Able to perform single and double heel raise with return of calcaneus to neutral position   Radiographs: None Assessment:   1. Pes planovalgus    Plan:  Patient was evaluated and treated and all questions answered.  Bilateral pes planovalgus -Pes planovalgus -I explained to patient the etiology of pes planovalgus and relationship with Planter fasciitis and various treatment options were discussed.  Given patient foot structure in the setting of Planter fasciitis I believe patient will benefit from custom-made orthotics to help control the hindfoot motion support the arch of the foot and take the stress away from plantar fascial.  Patient agrees with the plan like to proceed with orthotics -Patient was casted for orthotics   No follow-ups on file.

## 2022-11-15 ENCOUNTER — Telehealth: Payer: Self-pay | Admitting: Podiatry

## 2022-11-15 NOTE — Telephone Encounter (Signed)
Called pt she is goinna be out of town will call back to schedule picking up orthotics.   Orthotics are in the [place] office .

## 2023-01-12 ENCOUNTER — Ambulatory Visit: Admitting: Podiatry

## 2023-01-24 ENCOUNTER — Ambulatory Visit: Admitting: Podiatry

## 2023-01-26 ENCOUNTER — Ambulatory Visit (INDEPENDENT_AMBULATORY_CARE_PROVIDER_SITE_OTHER): Admitting: Podiatry

## 2023-01-26 DIAGNOSIS — Q666 Other congenital valgus deformities of feet: Secondary | ICD-10-CM

## 2023-01-26 NOTE — Progress Notes (Signed)
Orthotics were dispensed and they are functioning well.

## 2023-07-20 ENCOUNTER — Encounter: Payer: Self-pay | Admitting: Podiatry
